# Patient Record
Sex: Female | Born: 1935 | Race: White | Hispanic: No | Marital: Married | Smoking: Former smoker
Health system: Southern US, Community
[De-identification: ages and names within clinical notes are randomized; demographics above are authoritative.]

## PROBLEM LIST (undated history)

## (undated) DIAGNOSIS — D62 Acute posthemorrhagic anemia: Secondary | ICD-10-CM

## (undated) DIAGNOSIS — D638 Anemia in other chronic diseases classified elsewhere: Secondary | ICD-10-CM

## (undated) DIAGNOSIS — E538 Deficiency of other specified B group vitamins: Secondary | ICD-10-CM

## (undated) DIAGNOSIS — C562 Malignant neoplasm of left ovary: Secondary | ICD-10-CM

## (undated) DIAGNOSIS — R4182 Altered mental status, unspecified: Secondary | ICD-10-CM

## (undated) DIAGNOSIS — C801 Malignant (primary) neoplasm, unspecified: Secondary | ICD-10-CM

## (undated) DIAGNOSIS — T451X5A Adverse effect of antineoplastic and immunosuppressive drugs, initial encounter: Secondary | ICD-10-CM

## (undated) DIAGNOSIS — G620 Drug-induced polyneuropathy: Secondary | ICD-10-CM

## (undated) HISTORY — DX: Malignant neoplasm of left ovary: C56.2

## (undated) HISTORY — PX: OTHER SURGICAL HISTORY: SHX169

## (undated) HISTORY — DX: Drug-induced polyneuropathy: G62.0

## (undated) HISTORY — DX: Deficiency of other specified B group vitamins: E53.8

## (undated) HISTORY — DX: Malignant (primary) neoplasm, unspecified: C80.1

## (undated) HISTORY — DX: Adverse effect of antineoplastic and immunosuppressive drugs, initial encounter: T45.1X5A

## (undated) HISTORY — PX: REPLACEMENT TOTAL KNEE: SUR1224

---

## 1997-02-05 ENCOUNTER — Ambulatory Visit: Admission: RE | Admit: 1997-02-05 | Discharge: 1997-02-05 | Payer: Self-pay | Admitting: Obstetrics & Gynecology

## 2005-02-28 ENCOUNTER — Other Ambulatory Visit: Admission: RE | Admit: 2005-02-28 | Discharge: 2005-02-28 | Payer: Self-pay | Admitting: Dermatology

## 2007-04-05 ENCOUNTER — Ambulatory Visit (HOSPITAL_COMMUNITY): Admission: RE | Admit: 2007-04-05 | Discharge: 2007-04-05 | Payer: Self-pay | Admitting: Ophthalmology

## 2007-04-19 ENCOUNTER — Ambulatory Visit (HOSPITAL_COMMUNITY): Admission: RE | Admit: 2007-04-19 | Discharge: 2007-04-19 | Payer: Self-pay | Admitting: Ophthalmology

## 2008-04-01 ENCOUNTER — Inpatient Hospital Stay (HOSPITAL_COMMUNITY): Admission: RE | Admit: 2008-04-01 | Discharge: 2008-04-04 | Payer: Self-pay | Admitting: Orthopedic Surgery

## 2010-04-14 LAB — BASIC METABOLIC PANEL
BUN: 9 mg/dL (ref 6–23)
CO2: 29 mEq/L (ref 19–32)
CO2: 30 mEq/L (ref 19–32)
Calcium: 8.6 mg/dL (ref 8.4–10.5)
Calcium: 8.9 mg/dL (ref 8.4–10.5)
Chloride: 97 mEq/L (ref 96–112)
Creatinine, Ser: 0.97 mg/dL (ref 0.4–1.2)
Creatinine, Ser: 1.04 mg/dL (ref 0.4–1.2)
Creatinine, Ser: 1.15 mg/dL (ref 0.4–1.2)
GFR calc Af Amer: 55 mL/min — ABNORMAL LOW (ref >60)
GFR calc Af Amer: >60 mL/min (ref >60)
GFR calc Af Amer: >60 mL/min (ref >60)
GFR calc non Af Amer: 56 mL/min — ABNORMAL LOW (ref >60)
Glucose, Bld: 123 mg/dL — ABNORMAL HIGH (ref 70–99)

## 2010-04-14 LAB — CBC
HCT: 24.7 % — ABNORMAL LOW (ref 36.0–46.0)
MCHC: 34.8 g/dL (ref 30.0–36.0)
MCHC: 35.4 g/dL (ref 30.0–36.0)
MCV: 92 fL (ref 78.0–100.0)
Platelets: 185 10*3/uL (ref 150–400)
Platelets: 198 10*3/uL (ref 150–400)
RBC: 2.62 MIL/uL — ABNORMAL LOW (ref 3.87–5.11)
RBC: 2.69 MIL/uL — ABNORMAL LOW (ref 3.87–5.11)
RBC: 2.76 MIL/uL — ABNORMAL LOW (ref 3.87–5.11)
RDW: 13.2 % (ref 11.5–15.5)
WBC: 6.8 10*3/uL (ref 4.0–10.5)

## 2010-04-14 LAB — OSMOLALITY: Osmolality: 281 mOsm/kg (ref 275–300)

## 2010-04-15 LAB — BASIC METABOLIC PANEL
BUN: 9 mg/dL (ref 6–23)
CO2: 29 mEq/L (ref 19–32)
Calcium: 8.1 mg/dL — ABNORMAL LOW (ref 8.4–10.5)
Creatinine, Ser: 0.92 mg/dL (ref 0.4–1.2)
GFR calc non Af Amer: 59 mL/min — ABNORMAL LOW (ref >60)
Glucose, Bld: 121 mg/dL — ABNORMAL HIGH (ref 70–99)
Sodium: 135 mEq/L (ref 135–145)

## 2010-04-15 LAB — TYPE AND SCREEN
ABO/RH(D): O NEG
Antibody Screen: NEGATIVE

## 2010-04-15 LAB — CBC
HCT: 38.7 % (ref 36.0–46.0)
Hemoglobin: 9.5 g/dL — ABNORMAL LOW (ref 12.0–15.0)
MCHC: 34.5 g/dL (ref 30.0–36.0)
MCV: 92.7 fL (ref 78.0–100.0)
Platelets: 187 10*3/uL (ref 150–400)
RBC: 4.18 MIL/uL (ref 3.87–5.11)
RDW: 13.4 % (ref 11.5–15.5)
WBC: 8.5 10*3/uL (ref 4.0–10.5)

## 2010-04-15 LAB — URINE CULTURE: Colony Count: 1000

## 2010-04-15 LAB — COMPREHENSIVE METABOLIC PANEL
AST: 22 U/L (ref 0–37)
Alkaline Phosphatase: 36 U/L — ABNORMAL LOW (ref 39–117)
BUN: 22 mg/dL (ref 6–23)
CO2: 27 mEq/L (ref 19–32)
Chloride: 101 mEq/L (ref 96–112)
Creatinine, Ser: 1.06 mg/dL (ref 0.4–1.2)
GFR calc Af Amer: >60 mL/min (ref >60)
GFR calc non Af Amer: 50 mL/min — ABNORMAL LOW (ref >60)
Potassium: 3.9 mEq/L (ref 3.5–5.1)
Total Bilirubin: 0.6 mg/dL (ref 0.3–1.2)

## 2010-04-15 LAB — DIFFERENTIAL
Basophils Absolute: 0 10*3/uL (ref 0.0–0.1)
Basophils Relative: 0 % (ref 0–1)
Eosinophils Relative: 2 % (ref 0–5)
Lymphocytes Relative: 26 % (ref 12–46)
Monocytes Absolute: 0.4 10*3/uL (ref 0.1–1.0)

## 2010-04-15 LAB — URINALYSIS, ROUTINE W REFLEX MICROSCOPIC
Bilirubin Urine: NEGATIVE
Glucose, UA: NEGATIVE mg/dL
Hgb urine dipstick: NEGATIVE
Ketones, ur: NEGATIVE mg/dL
Protein, ur: NEGATIVE mg/dL
pH: 5.5 (ref 5.0–8.0)

## 2010-04-15 LAB — PROTIME-INR: Prothrombin Time: 13.6 seconds (ref 11.6–15.2)

## 2010-05-18 NOTE — Discharge Summary (Signed)
NAMEMarland Kitchen  Janice Rogers, Janice Rogers NO.:  [id number]   MEDICAL RECORD NO.:  [id number]          PATIENT TYPE:  INP   LOCATION:  5017                         FACILITY:  [facility]   PHYSICIAN:  Claude Manges. Whitfield, M.D.DATE OF BIRTH:  1928/09/29   DATE OF ADMISSION:  04/01/2008  DATE OF DISCHARGE:  04/04/2008                               DISCHARGE SUMMARY   ADMISSION DIAGNOSIS:  Osteoarthritis of the left knee.   DISCHARGE DIAGNOSES:  1. Osteoarthritis of the left knee.  2. Hypothyroidism.  3. Hypertension.  4. Gastroesophageal reflux disease.   PROCEDURE:  Left total knee arthroplasty.   HISTORY:  Janice Rogers is a 77 year old female with longstanding  left knee pain.  Underwent arthroscopic debridement of the left knee  with subsequent Euflexxa injections which have failed.  Now having  severe pain with constant ache.  Radiographic osteoarthritis of the left  knee.  Indicated for left total knee arthroplasty.   HOSPITAL COURSE:  This 77 year old female was admitted March 24, 2008,  after appropriate laboratory studies were obtained as well as 1 gram of  vancomycin IV on-call to the operating room.  She was taken to the  operating room where she underwent a left total knee arthroplasty.  She  tolerated the procedure well.  She was continued on vancomycin 1 gram IV  q.12 h times one dose.  A Dilaudid reduced dose PCA pump was used for  pain management.  She was started on Lovenox 30 mg subcu q.12 h.  SCDs  for antithrombotic prophylaxis.  Consult PT, OT were made.  She was  allowed to be weightbearing as tolerated.  A Foley was placed  intraoperatively.  She did have a sore throat postoperatively and was  started on Cepacol lozenges.  She was allowed out of bed to the chair  the following day.  Her PCA was discontinued and she was hep locked.  She did develop hyponatremia and hypokalemia on April 1.  At that time  her hydrochlorothiazide was held.  Potassium chloride 30  mEq p.o. b.i.d.  was given.  She was fluid restricted to 1000 mL per 24 hours.  CPM was  increased to zero to 60 degrees for 6-8 hours per day.  Her immobilizer  was also discontinued.  The remainder of her hospital course was  uneventful.  She was discharged on April 2 to return back to the office  in 2 weeks for followup.  EKG revealed sinus bradycardia with sinus  arrhythmias, otherwise normal.   RADIOGRAPHIC STUDIES:  Chest x-ray of March 27, 2008, revealed no acute  disease.   LABORATORY STUDIES:  Admitted with a hemoglobin of 13.5, hematocrit  38.7%, white count 8500, platelets were 273,000.  Discharge hemoglobin  8.6, hematocrit 24.2%, white count 6800, platelets 185,000.  Preop coag  studies a pro-time 13.6, INR 1.0, PTT was 26.  Preop sodium 136,  potassium 3.9, chloride 101, CO2 27, glucose 96, BUN 22, creatinine  1.06, GFR was 50, bilirubin 0.6, alk phos 36, GOT 22, GPT 15, total  protein 6.8, albumin 3.8, calcium 9.9.  Discharge sodium 142, potassium  4.2, chloride 99, CO2 31, glucose  112, BUN 10, creatinine 0.97, GFR 56,  calcium 8.9.  On April 1 the sodium was 130, potassium 3.0 and repeated  8 hours later revealed a sodium 137, potassium 3.2.  Urinalysis was  benign for voided urine.  Urine culture showed 1000 colonies of  insignificant growth.   DISCHARGE INSTRUCTIONS:  She may be returned to a regular diet.  She is  to increase her activity slowly.  Use her walker three-point gait  weightbearing as tolerated.  She may shower on Sunday, April 06, 2008.  She can allow the water to run over the incision but do not scrub over  the incision.  Let the incision air dry.  May use a Betadine swab over  the staples and then cover with sterile 4x4 dressings.  This is to be  done once daily.  CPM zero to 60 degrees for 6-8 hours per day  increasing by 5-10 degrees per day.   DISCHARGE MEDICATIONS:  1. Medications to include Premarin 0.625 daily.  2. Levothyroxine 100 mcg  daily.  3. Atenolol 50 mg daily.  4. TriCor 145 mg daily.  5. Colace 100 mg p.o. b.i.d. as a stool softener.  6. Prilosec 20 mg daily.  7. Triamterene/HCTZ 37.5/25 one p.o. q.h.s.  8. Benadryl 25 mg q.h.s. for sleep.  9. Senokot-S p.r.n. constipation.  10.Tylenol 325 mg 1 to 2 tablets q.4 h p.r.n. not to exceed Tylenol      total consumption of 4000 mg per 24 hours.  11.Percocet 5/325 at 1 to 2 tabs every 4 as needed for pain.  12.Robaxin 500 mg p.o. t.i.d. p.r.n. spasms.  13.Lovenox 30 mg subcu b.i.d. (q.12 hours for a total of 2 weeks).   DISCHARGE CONDITION:  She was discharged in improved condition.      Oris Drone Petrarca, P.A.-C.      Claude Manges. Cleophas Dunker, M.D.  Electronically Signed    BDP/MEDQ  D:  04/04/2008  T:  04/04/2008  Job:  [id number]

## 2010-05-18 NOTE — Op Note (Signed)
NAMEMarland Kitchen  BROOK, GERACI NO.:  [id number]   MEDICAL RECORD NO.:  [id number]          PATIENT TYPE:  INP   LOCATION:  5017                         FACILITY:  [facility]   PHYSICIAN:  Rodney A. Mortenson, M.D.DATE OF BIRTH:  12-Jan-1928   DATE OF PROCEDURE:  04/01/2008  DATE OF DISCHARGE:                               OPERATIVE REPORT   PREOPERATIVE DIAGNOSIS:  End-stage osteoarthritis, left knee.   POSTOPERATIVE DIAGNOSIS:  End-stage osteoarthritis, left knee.   OPERATION:  Total knee replacement on left using computer navigation  using DePuy a standard size, +12.5-mm poly insert with a size 3 MBT  keeled tibial tray, metal-back patella, standard plus, and a standard  plus cemented primary femoral component.   SURGEON:  Lenard Galloway. Chaney Malling, MD   ASSISTANT:  Dr. Su Hilt.   ANESTHESIA:  General.   PROCEDURE:  The patient placed on the operating table in supine position  with pneumatic tourniquet about the left upper thigh.  The entire left  lower extremity was prepped with DuraPrep and draped down in the usual  manner.  The leg was wrapped out with an Esmarch, tourniquet was  elevated.  Incision made from the tibial tubercle to above the patella  about 4 inches.  Skin edges were retracted.  A long medial parapatellar  incision was made.  Self-retaining retractor was put in place.  A  synovectomy was then done.  Knee was flexed 90 degrees.  Both medial and  lateral meniscus were excised and ACL was released in the femoral notch  area.  Once accomplished to my satisfaction, excellent access to the  joint was achieved.  Two Schanz pins were placed in the distal femur in  the standard manner and two Schanz pins were placed in the proximal  tibia in the standard manner.  Femoral and tibial arrays were then  applied and registration was started.  First, the femoral head center  was registered following all the computer promptings and tibial  mechanical axis, then we defined  the proximal tibial plateau and defined  the femoral condyles and epicondyles, all following computer promptings.  When this was done, tibial planning was done.  Tibial resection settings  were then registered, and the tibial resection guide was placed over the  anterior aspect of the tibia.  Following computer promptings, this was  placed in the ideal position and locked in position with locking pins  and proximal tibia was amputated a large single piece.  This was down  very successfully.  A confirmation guide was used and the first cut was  ideal.  Femoral implant planning was then done following computer  promptings and then each step was followed.  First, the anterior and  posterior femoral resection guide was put in place, locked in position  following computer promptings, and the anterior and posterior cuts were  made.  Confirmation guide was used to help make the cuts in an ideal  position.  Once this was accomplished, femoral pin and guide was placed  over the distal end of the femur.  Following the resection suggestions,  it was locked in position, and distal end of the  femur was amputated.  The confirmation guide saw was used to help clean out these cuts.  Next,  the final femoral cutting jig was placed over the distal end of femur  and locked in position.  Chamfer cuts were made and large drill holes  were made.  At this point, the tibia was subluxed anteriorly using a  McHale and this exposed a proximal tibial cut surface.  The size 3 keel  fit very nice and this was locked in position.  The collar was put in  position on the cutting block and drill holes made in the proximal  tibia.  Then, the wing broaches passed down the proximal end of tibia  and locked in position.  A 12.5-mm poly seem to be the appropriate poly,  it was put in position, and the trial femoral components were placed  over the distal end of femur and reduced.  The knee was then put through  full range of  motion.  There was excellent stability, good full range of  motion, and AP drawer was minimal.  No instability to varus and valgus  stress.  There was no spinning of the poly, excellent tracking of the  poly.  Attention was then turned to the posterior aspect of the patella.  The patella cutting guide was put in position.  Posterior aspect of the  patella amputated and then 3 drill holes were placed on the back of the  patella using a drill hole guide.  Next, the patella trial was put in  position.  The knee was then articulated and a lateral release was not  felt be necessary as the patella was tracking very nicely in the midline  with no tilt.  All the trial components were then removed.  Pulsating  lavage was used.  All bony debris was removed.  Glue was mixed and each  of the components were sequentially inserted starting with the tibia and  then the distal femur, the poly trial, and the patella.  The glue was  allowed to cure.  Once this had cured, an osteotome was used to remove  any excess glue that was seen, and the knee was irrigated again with  pulsating lavage.  All debris was removed.  The final trial poly was  removed.  Tourniquet was dropped and bleeders were coagulated.  The  wound was fairly dry and no arterial pumpers were seen.  FloSeal was  placed in the wound.  Once this was accomplished, the final tibial poly  was then inserted in place and the knee articulated once again.  Again,  there was excellent motion and excellent stability.  The long medial  parapatellar incision was then closed with interrupted Ethibond sutures.  Good stability and good range of motion.  Subcutaneous tissue was closed  with Vicryl and skin was closed with stainless steel staples.  A sterile  dressing was applied, and the patient returned to recovery room in  excellent condition.  Technically, this went extremely well.  I was very  pleased with final surgical outcome, range of motion, and  stability.      Rodney A. Chaney Malling, M.D.  Electronically Signed     RAM/MEDQ  D:  04/01/2008  T:  04/02/2008  Job:  [id number]

## 2010-09-27 LAB — BASIC METABOLIC PANEL
Calcium: 9.3
GFR calc non Af Amer: 57 — ABNORMAL LOW
Glucose, Bld: 109 — ABNORMAL HIGH
Potassium: 4.3
Sodium: 138

## 2010-09-27 LAB — HEMOGLOBIN AND HEMATOCRIT, BLOOD: Hemoglobin: 13.5

## 2010-11-22 DIAGNOSIS — R0602 Shortness of breath: Secondary | ICD-10-CM

## 2011-01-12 DIAGNOSIS — Z452 Encounter for adjustment and management of vascular access device: Secondary | ICD-10-CM

## 2011-01-12 DIAGNOSIS — C569 Malignant neoplasm of unspecified ovary: Secondary | ICD-10-CM

## 2011-05-31 ENCOUNTER — Encounter: Payer: Medicare Other | Admitting: Hematology and Oncology

## 2011-05-31 DIAGNOSIS — Z5111 Encounter for antineoplastic chemotherapy: Secondary | ICD-10-CM

## 2011-05-31 DIAGNOSIS — C569 Malignant neoplasm of unspecified ovary: Secondary | ICD-10-CM

## 2011-06-08 ENCOUNTER — Encounter (INDEPENDENT_AMBULATORY_CARE_PROVIDER_SITE_OTHER): Payer: Medicare Other | Admitting: Internal Medicine

## 2011-06-08 DIAGNOSIS — D649 Anemia, unspecified: Secondary | ICD-10-CM

## 2011-06-08 DIAGNOSIS — C569 Malignant neoplasm of unspecified ovary: Secondary | ICD-10-CM

## 2011-06-08 DIAGNOSIS — R5383 Other fatigue: Secondary | ICD-10-CM

## 2011-06-08 DIAGNOSIS — R197 Diarrhea, unspecified: Secondary | ICD-10-CM

## 2011-06-08 DIAGNOSIS — R5381 Other malaise: Secondary | ICD-10-CM

## 2011-07-06 ENCOUNTER — Encounter (INDEPENDENT_AMBULATORY_CARE_PROVIDER_SITE_OTHER): Payer: Medicare Other | Admitting: Internal Medicine

## 2011-07-06 DIAGNOSIS — D649 Anemia, unspecified: Secondary | ICD-10-CM

## 2011-07-06 DIAGNOSIS — C569 Malignant neoplasm of unspecified ovary: Secondary | ICD-10-CM

## 2011-08-01 ENCOUNTER — Encounter: Payer: Medicare Other | Admitting: Internal Medicine

## 2011-08-01 DIAGNOSIS — C569 Malignant neoplasm of unspecified ovary: Secondary | ICD-10-CM

## 2011-08-02 ENCOUNTER — Encounter: Payer: Medicare Other | Admitting: Internal Medicine

## 2011-09-12 DIAGNOSIS — Z452 Encounter for adjustment and management of vascular access device: Secondary | ICD-10-CM

## 2011-09-12 DIAGNOSIS — C569 Malignant neoplasm of unspecified ovary: Secondary | ICD-10-CM

## 2011-10-17 DIAGNOSIS — C569 Malignant neoplasm of unspecified ovary: Secondary | ICD-10-CM

## 2011-10-17 DIAGNOSIS — Z452 Encounter for adjustment and management of vascular access device: Secondary | ICD-10-CM

## 2011-11-22 DIAGNOSIS — Z452 Encounter for adjustment and management of vascular access device: Secondary | ICD-10-CM

## 2011-11-22 DIAGNOSIS — C569 Malignant neoplasm of unspecified ovary: Secondary | ICD-10-CM

## 2012-01-27 ENCOUNTER — Encounter (INDEPENDENT_AMBULATORY_CARE_PROVIDER_SITE_OTHER): Payer: Self-pay

## 2012-01-27 DIAGNOSIS — E039 Hypothyroidism, unspecified: Secondary | ICD-10-CM

## 2012-01-27 DIAGNOSIS — I10 Essential (primary) hypertension: Secondary | ICD-10-CM

## 2012-01-27 DIAGNOSIS — C569 Malignant neoplasm of unspecified ovary: Secondary | ICD-10-CM

## 2012-02-21 DIAGNOSIS — C569 Malignant neoplasm of unspecified ovary: Secondary | ICD-10-CM

## 2012-04-03 DIAGNOSIS — C569 Malignant neoplasm of unspecified ovary: Secondary | ICD-10-CM

## 2012-04-03 DIAGNOSIS — Z452 Encounter for adjustment and management of vascular access device: Secondary | ICD-10-CM

## 2012-05-17 DIAGNOSIS — Z452 Encounter for adjustment and management of vascular access device: Secondary | ICD-10-CM

## 2012-05-17 DIAGNOSIS — C569 Malignant neoplasm of unspecified ovary: Secondary | ICD-10-CM

## 2012-06-28 DIAGNOSIS — C569 Malignant neoplasm of unspecified ovary: Secondary | ICD-10-CM

## 2012-06-28 DIAGNOSIS — Z452 Encounter for adjustment and management of vascular access device: Secondary | ICD-10-CM

## 2012-08-14 DIAGNOSIS — Z452 Encounter for adjustment and management of vascular access device: Secondary | ICD-10-CM

## 2012-08-14 DIAGNOSIS — C569 Malignant neoplasm of unspecified ovary: Secondary | ICD-10-CM

## 2012-09-25 DIAGNOSIS — C569 Malignant neoplasm of unspecified ovary: Secondary | ICD-10-CM

## 2012-09-25 DIAGNOSIS — Z452 Encounter for adjustment and management of vascular access device: Secondary | ICD-10-CM

## 2012-11-06 DIAGNOSIS — C569 Malignant neoplasm of unspecified ovary: Secondary | ICD-10-CM

## 2012-11-06 DIAGNOSIS — Z452 Encounter for adjustment and management of vascular access device: Secondary | ICD-10-CM

## 2015-08-05 ENCOUNTER — Encounter (HOSPITAL_BASED_OUTPATIENT_CLINIC_OR_DEPARTMENT_OTHER): Payer: Medicare Other

## 2015-08-05 ENCOUNTER — Encounter (HOSPITAL_COMMUNITY): Payer: Self-pay | Admitting: Oncology

## 2015-08-05 ENCOUNTER — Ambulatory Visit (HOSPITAL_COMMUNITY): Payer: Self-pay | Admitting: Hematology & Oncology

## 2015-08-05 ENCOUNTER — Encounter (HOSPITAL_COMMUNITY): Payer: Medicare Other | Attending: Oncology | Admitting: Oncology

## 2015-08-05 VITALS — BP 179/83 | HR 60 | Temp 97.9°F | Resp 16 | Ht 63.0 in | Wt 157.2 lb

## 2015-08-05 DIAGNOSIS — E538 Deficiency of other specified B group vitamins: Secondary | ICD-10-CM | POA: Diagnosis not present

## 2015-08-05 DIAGNOSIS — C562 Malignant neoplasm of left ovary: Secondary | ICD-10-CM | POA: Diagnosis not present

## 2015-08-05 DIAGNOSIS — Z95828 Presence of other vascular implants and grafts: Secondary | ICD-10-CM

## 2015-08-05 HISTORY — DX: Malignant neoplasm of left ovary: C56.2

## 2015-08-05 HISTORY — DX: Deficiency of other specified B group vitamins: E53.8

## 2015-08-05 MED ORDER — HEPARIN SOD (PORK) LOCK FLUSH 100 UNIT/ML IV SOLN
INTRAVENOUS | Status: AC
  Filled 2015-08-05: qty 5

## 2015-08-05 MED ORDER — HEPARIN SOD (PORK) LOCK FLUSH 100 UNIT/ML IV SOLN
500.0000 [IU] | Freq: Once | INTRAVENOUS | Status: AC
  Administered 2015-08-05: 500 [IU] via INTRAVENOUS

## 2015-08-05 MED ORDER — SODIUM CHLORIDE 0.9% FLUSH
10.0000 mL | Freq: Once | INTRAVENOUS | Status: AC
  Administered 2015-08-05: 10 mL via INTRAVENOUS

## 2015-08-05 MED ORDER — CYANOCOBALAMIN 1000 MCG/ML IJ SOLN: 1000.0000 ug | INTRAMUSCULAR | 11 refills | Status: DC

## 2015-08-05 NOTE — Patient Instructions (Signed)
[place] at [facility] Discharge Instructions  RECOMMENDATIONS MADE BY THE CONSULTANT AND ANY TEST RESULTS WILL BE SENT TO YOUR REFERRING PHYSICIAN.  Port flush today as ordered.  Thank you for choosing [place] at [facility] to provide your oncology and hematology care.  To afford each patient quality time with our provider, please arrive at least 15 minutes before your scheduled appointment time.   Beginning January 23rd 2017 lab work for the [facility] will be done in the  Main lab at [facility] on 1st floor. If you have a lab appointment with the [facility] please come in thru the  Main Entrance and check in at the main information desk  You need to re-schedule your appointment should you arrive 10 or more minutes late.  We strive to give you quality time with our providers, and arriving late affects you and other patients whose appointments are after yours.  Also, if you no show three or more times for appointments you may be dismissed from the clinic at the providers discretion.     Again, thank you for choosing [facility].  Our hope is that these requests will decrease the amount of time that you wait before being seen by our physicians.       _____________________________________________________________  Should you have questions after your visit to [facility], please contact our office at [phone] between the hours of 8:30 a.m. and 4:30 p.m.  Voicemails left after 4:30 p.m. will not be returned until the following business day.  For prescription refill requests, have your pharmacy contact our office.         Resources For Cancer Patients and their Caregivers ? American Cancer Society: Can assist with transportation, wigs, general needs, runs Look Good Feel Better.        210-710-1053 ? Cancer Care: Provides financial assistance, online support groups, medication/co-pay assistance.   1-800-813-HOPE 670 682 0343) ? [facility] Assists [place] cancer patients and their families through emotional , educational and financial support.  614-041-7089 ? [place] DSS Where to apply for food stamps, Medicaid and utility assistance. 416 692 4072 ? [facility]: Transportation to medical appointments. 865-192-6480 ? Social Security Administration: May apply for disability if have a Stage IV cancer. 919-743-8364 813-232-8028 ? [employer], Disability and Transit Services: Assists with nutrition, care and transit needs. [facility] Support Programs: @10RELATIVEDAYS @ > Cancer Support Group  2nd Tuesday of the month 1pm-2pm, Journey Room  > Creative Journey  3rd Tuesday of the month 1130am-1pm, Journey Room  > Look Good Feel Better  1st Wednesday of the month 10am-12 noon, Journey Room (Call [facility] to register [phone])

## 2015-08-05 NOTE — Assessment & Plan Note (Signed)
B12 deficiency, B12 1000 mcg injections are being administered locally by her pharmacy.  New Rx for B12 1000 mcg injection x 11 refills escribed to her pharmacy.

## 2015-08-05 NOTE — Assessment & Plan Note (Addendum)
Clinical stage IIIC LEFT Ovarian cancer treated surgically on 08/22/2008 followed by 6 cycles of Carboplatin/Paclitaxel?Avastin in accordance with GOG 252- regimen II) finishing in March 2011 followed by Avastin maintence untile recurrent disease was documented on 08/30/2010 at which time she was treated with Carboplatin/Gemcitabine finishing in July 2013 with transition to Tamoxifen 20 mg daily (07/28/2011- 07/09/2013), then Doxil x 6 cycles (07/10/2013- 12/03/2013).  Currently on Letrozole daily since ~ Jan 2017.  Oncology history is developed.    Port flush is due today.  This will be completed.  No role for labs today.    Labs in 6 weeks with her next port flush: CBC diff, CMET, CA 125.  She is to continue follow-up with Dr. Rhodia Albright at [facility] as scheduled on 09/10/2015.  Return in 6 weeks for port-flush and labs.  Return in 3 months for follow-up.  We will be on the look out for Dr. Asa Saunas upcoming note.

## 2015-08-05 NOTE — Progress Notes (Signed)
[facility] Hematology/Oncology Consultation   Name: Janice Rogers      MRN: [id number]   Date: 09/06/2015 Time:11:13 PM   REFERRING PHYSICIAN:  Everardo All, MD (Medical Oncologist at [facility])  [place]:  Transfer of medical oncology care   DIAGNOSIS:  Clinically Stage IIIC Ovarian Cancer, Left    Ovarian cancer on left [facility])   08/22/2008 Procedure    Exploratory laparotomy, BSO, omentectomy, and bilateral pelvic and para-aortic lymph node dissection (optimally debulked)      10/22/2008 - 03/05/2009 Chemotherapy    GOG 252 Regimen II: Taxol 80 mg/m2 IV days 1, 8, and 15, IP Carboplatin AUC 6 day 1, and Avastin 15 mg/kg day 1 every 21 days x 6 cycles      03/13/2009 - 02/03/2010 Chemotherapy    GOG Regimen II: Maintenance Avastin 15 mg/kg x 16 additional cycles      08/26/2010 Progression    Recurrent disease documented      08/30/2010 - 06/01/2011 Chemotherapy    Gemcitabine 800 mg/m, carboplatin AUC 2 days 1, 8, 15 every 28 days.      07/28/2011 - 07/09/2013 Anti-estrogen oral therapy    Tamoxifen 20 mg daily      07/09/2013 Progression    Progression of disease noted on CT scan      07/10/2013 - 12/03/2013 Chemotherapy    Doxil 50 mg/m every 4 weeks 6 cycles      12/18/2014 Imaging    CT CAP- overall unchanged peritoneal metastatic disease. Multiple other findings are also unchanged from prior CTs. Stable disease.      01/12/2015 -  Anti-estrogen oral therapy    Femara 2.5 mg (Approximate start date)        HISTORY OF PRESENT ILLNESS:   Janice Rogers is a 82 y.o. female with a medical history significant for B12 deficiency, recurrent falls who is referred to the [facility] for transfer of her medical oncology care for clinical stage IIIC ovarian cancer, left. I personally reviewed and went over laboratory results with the patient.  The results are noted within this dictation.  I personally reviewed and went over  radiographic studies with the patient.  The results are noted within this dictation.    Chart is reviewed.  In summary, patient was diagnosed with stage IIIc left ovarian cancer that has been managed primarily by Dr.Lentz at [facility].  She is undergone optimal debulking surgery in 2010 followed by systemic chemotherapy following the GOG 252 regimen II.  Unfortunately, in 2012 she was noted to have a recurrence of disease. She was therefore treated with gemcitabine and carboplatin and transitioned to tamoxifen maintenance therapy. In 2015, she had progression of disease noted on CT scans. She was treated in July 2015 with Doxil every 4 weeks 6 cycles finishing on 12/03/2013. Since that time, she has been on Femara maintenance therapy with CT imaging demonstrating stable disease.  More recently, she's been having difficulties with unexplained falls. She has been worked up by neurology without any clear explanation. She was noted to have a low B12 level and therefore this been replaced. Unfortunately, this has not corrected her symptoms. Appetite is fine. Denies abdominal pain. Takes her femara daily.    Review of Systems  Constitutional: Negative.   HENT: Negative.   Eyes: Negative.   Respiratory: Negative.   Cardiovascular: Negative.   Gastrointestinal: Negative.   Genitourinary: Negative.   Musculoskeletal: Positive for falls.  Skin: Negative.   Neurological: Negative.   Endo/Heme/Allergies: Negative.   Psychiatric/Behavioral: Negative.   14 point review of systems was performed and is negative except as detailed under history of present illness and above   PAST MEDICAL HISTORY:   Past Medical History:  Diagnosis Date  . B12 deficiency 08/05/2015  . Cancer ([place])   . Ovarian cancer on left ([place]) 08/05/2015    ALLERGIES: Allergies  Allergen Reactions  . Doxycycline Rash and Hives  . Gemcitabine Shortness Of Breath  . Penicillins Rash and Hives      MEDICATIONS: I have reviewed the  patient's current medications.     PAST SURGICAL HISTORY Past Surgical History:  Procedure Laterality Date  . catarct surgery    . REPLACEMENT TOTAL KNEE    BSO, omentectomy, bilateral pelvic and para aortic LN dissection Breast biopsy X 2  FAMILY HISTORY: History reviewed. No pertinent family history. Cancer Mother  . Hearing loss Mother  . Vision loss Mother  . Arthritis Father  . Cancer Father  . Diabetes Sister  . Early death Brother    SOCIAL HISTORY:  reports that she has quit smoking. She has never used smokeless tobacco. She reports that she does not drink alcohol or use drugs.  Social History   Social History  . Marital status: Married    Spouse name: N/A  . Number of children: N/A  . Years of education: N/A   Social History Main Topics  . Smoking status: Former Research scientist (life sciences)  . Smokeless tobacco: Never Used  . Alcohol use No  . Drug use: No  . Sexual activity: Not Asked   Other Topics Concern  . None   Social History Narrative  . None  Smoking status: Former Smoker  Packs/day: 0.50  Years: 15.00  . Smokeless tobacco: Never Used  . Alcohol use Yes  Comment: very little  One daughter. No regular exercise. No pets.    PERFORMANCE STATUS: The patient's performance status is 1 - Symptomatic but completely ambulatory  PHYSICAL EXAM: Most Recent Vital Signs: Blood pressure (!) 179/83, pulse 60, temperature 97.9 F (36.6 C), resp. rate 16, height 5\' 3"  (1.6 m), weight 157 lb 3.2 oz (71.3 kg), SpO2 98 %. General appearance: alert, cooperative, appears stated age, no distress and accompanied by husband Head: Normocephalic, without obvious abnormality, atraumatic Eyes: negative findings: lids and lashes normal, conjunctivae and sclerae normal and corneas clear Throat: normal findings: oropharynx pink & moist without lesions or evidence of thrush Neck: no adenopathy and supple, symmetrical, trachea midline Lungs: clear to auscultation bilaterally Heart:  regular rate and rhythm, S1, S2 normal, no murmur, click, rub or gallop Abdomen: soft, non-tender; bowel sounds normal; no masses,  no organomegaly Extremities: extremities normal, atraumatic, no cyanosis or edema Skin: Skin color, texture, turgor normal. No rashes or lesions Lymph nodes: Cervical, supraclavicular, and axillary nodes normal. Neurologic: Grossly normal  LABORATORY DATA:        RADIOGRAPHY:    PATHOLOGY:  N/A   ASSESSMENT/PLAN:   Ovarian cancer on left [facility]) Indwelling Port a cath  Clinical stage IIIC LEFT Ovarian cancer treated surgically on 08/22/2008 followed by 6 cycles of Carboplatin/Paclitaxel?Avastin in accordance with GOG 252- regimen II) finishing in March 2011 followed by Avastin maintence untile recurrent disease was documented on 08/30/2010 at which time she was treated with Carboplatin/Gemcitabine finishing in July 2013 with transition to Tamoxifen 20 mg daily (07/28/2011- 07/09/2013), then Doxil x 6 cycles (07/10/2013- 12/03/2013).  Currently on Letrozole daily since ~  Jan 2017.  Oncology history is developed.    Port flush is due today.  This will be completed.  No role for labs today.    Labs in 6 weeks with her next port flush: CBC diff, CMET, CA 125.  She is to continue follow-up with Dr. Rhodia Albright at [facility] as scheduled on 09/10/2015.  Return in 6 weeks for port-flush and labs.  Return in 3 months for follow-up.  We will be on the look out for Dr. Asa Saunas upcoming note.  B12 deficiency Falls B12 deficiency, B12 1000 mcg injections are being administered locally by her pharmacy.  New Rx for B12 1000 mcg injection x 11 refills escribed to her pharmacy.   ORDERS PLACED FOR THIS ENCOUNTER: Orders Placed This Encounter  Procedures  . CBC with Differential  . Comprehensive metabolic panel  . CA 125  . Vitamin B12  . CBC with Differential  . Comprehensive metabolic panel  . CA 125    All questions were answered. The patient knows to call the clinic  with any problems, questions or concerns. We can certainly see the patient much sooner if necessary.  This note is electronically signed [id number] Cyril Mourning, MD  09/06/2015 11:13 PM

## 2015-08-05 NOTE — Progress Notes (Signed)
Janice Rogers presented for Portacath access and flush. Proper placement of portacath confirmed by CXR. Portacath located right chest wall accessed with  H 20 needle. Good blood return present. Portacath flushed with 69ml NS and 500U/92ml Heparin and needle removed intact. Procedure without incident. Patient tolerated procedure well.

## 2015-08-05 NOTE — Patient Instructions (Signed)
Janice Rogers at [facility] Discharge Instructions  RECOMMENDATIONS MADE BY THE CONSULTANT AND ANY TEST RESULTS WILL BE SENT TO YOUR REFERRING PHYSICIAN.  Port flush today and every 6 weeks.  Return in 3 months for follow up with Dr. Whitney Muse.   Thank you for choosing [place] at [facility] to provide your oncology and hematology care.  To afford each patient quality time with our provider, please arrive at least 15 minutes before your scheduled appointment time.   Beginning January 23rd 2017 lab work for the [facility] will be done in the  Main lab at [facility] on 1st floor. If you have a lab appointment with the [place] please come in thru the  Main Entrance and check in at the main information desk  You need to re-schedule your appointment should you arrive 10 or more minutes late.  We strive to give you quality time with our providers, and arriving late affects you and other patients whose appointments are after yours.  Also, if you no show three or more times for appointments you may be dismissed from the clinic at the providers discretion.     Again, thank you for choosing [facility].  Our hope is that these requests will decrease the amount of time that you wait before being seen by our physicians.       _____________________________________________________________  Should you have questions after your visit to [facility], please contact our office at [phone] between the hours of 8:30 a.m. and 4:30 p.m.  Voicemails left after 4:30 p.m. will not be returned until the following business day.  For prescription refill requests, have your pharmacy contact our office.         Resources For Cancer Patients and their Caregivers ? American Cancer Society: Can assist with transportation, wigs, general needs, runs Look Good Feel Better.        9075027160 ? Cancer Care: Provides financial  assistance, online support groups, medication/co-pay assistance.  1-800-813-HOPE 810-517-4643) ? [place] Assists [place] cancer patients and their families through emotional , educational and financial support.  7630054249 ? [place] DSS Where to apply for food stamps, Medicaid and utility assistance. 9104321755 ? RCATS: Transportation to medical appointments. 208 347 7547 ? Social Security Administration: May apply for disability if have a Stage IV cancer. 302-311-9562 867-314-8757 ? [employer], Disability and Transit Services: Assists with nutrition, care and transit needs. [place] Support Programs: @10RELATIVEDAYS @ > Cancer Support Group  2nd Tuesday of the month 1pm-2pm, Journey Room  > Creative Journey  3rd Tuesday of the month 1130am-1pm, Journey Room  > Look Good Feel Better  1st Wednesday of the month 10am-12 noon, Journey Room (Call [place] to register [phone])

## 2015-09-06 ENCOUNTER — Encounter (HOSPITAL_COMMUNITY): Payer: Self-pay | Admitting: Oncology

## 2015-09-16 ENCOUNTER — Encounter (HOSPITAL_COMMUNITY): Payer: Medicare Other | Attending: Oncology

## 2015-09-16 ENCOUNTER — Encounter (HOSPITAL_COMMUNITY): Payer: Self-pay

## 2015-09-16 VITALS — BP 134/88 | HR 87 | Temp 98.5°F | Resp 16

## 2015-09-16 DIAGNOSIS — E538 Deficiency of other specified B group vitamins: Secondary | ICD-10-CM | POA: Insufficient documentation

## 2015-09-16 DIAGNOSIS — Z95828 Presence of other vascular implants and grafts: Secondary | ICD-10-CM

## 2015-09-16 DIAGNOSIS — Z452 Encounter for adjustment and management of vascular access device: Secondary | ICD-10-CM

## 2015-09-16 DIAGNOSIS — C562 Malignant neoplasm of left ovary: Secondary | ICD-10-CM

## 2015-09-16 MED ORDER — SODIUM CHLORIDE 0.9% FLUSH
10.0000 mL | INTRAVENOUS | Status: DC | PRN
Start: 2015-09-16 — End: 2015-09-16
  Administered 2015-09-16: 10 mL via INTRAVENOUS
  Filled 2015-09-16: qty 10

## 2015-09-16 MED ORDER — HEPARIN SOD (PORK) LOCK FLUSH 100 UNIT/ML IV SOLN
INTRAVENOUS | Status: AC
  Filled 2015-09-16: qty 5

## 2015-09-16 MED ORDER — HEPARIN SOD (PORK) LOCK FLUSH 100 UNIT/ML IV SOLN
500.0000 [IU] | Freq: Once | INTRAVENOUS | Status: AC
  Administered 2015-09-16: 500 [IU] via INTRAVENOUS

## 2015-09-16 NOTE — Patient Instructions (Signed)
[place] at [facility] Discharge Instructions  RECOMMENDATIONS MADE BY THE CONSULTANT AND ANY TEST RESULTS WILL BE SENT TO YOUR REFERRING PHYSICIAN.  You had your prot flushed today. Return as scheduled,   Thank you for choosing [place] at [facility] to provide your oncology and hematology care.  To afford each patient quality time with our provider, please arrive at least 15 minutes before your scheduled appointment time.   Beginning January 23rd 2017 lab work for the [facility] will be done in the  Main lab at [facility] on 1st floor. If you have a lab appointment with the [place] please come in thru the  Main Entrance and check in at the main information desk  You need to re-schedule your appointment should you arrive 10 or more minutes late.  We strive to give you quality time with our providers, and arriving late affects you and other patients whose appointments are after yours.  Also, if you no show three or more times for appointments you may be dismissed from the clinic at the providers discretion.     Again, thank you for choosing [facility].  Our hope is that these requests will decrease the amount of time that you wait before being seen by our physicians.       _____________________________________________________________  Should you have questions after your visit to [facility], please contact our office at [phone] between the hours of 8:30 a.m. and 4:30 p.m.  Voicemails left after 4:30 p.m. will not be returned until the following business day.  For prescription refill requests, have your pharmacy contact our office.         Resources For Cancer Patients and their Caregivers ? American Cancer Society: Can assist with transportation, wigs, general needs, runs Look Good Feel Better.        440-515-6495 ? Cancer Care: Provides financial assistance, online support groups,  medication/co-pay assistance.  1-800-813-HOPE 662-289-3486) ? [place] Assists [place] cancer patients and their families through emotional , educational and financial support.  269-705-2004 ? [place] DSS Where to apply for food stamps, Medicaid and utility assistance. (905)030-2377 ? RCATS: Transportation to medical appointments. (469)575-2282 ? Social Security Administration: May apply for disability if have a Stage IV cancer. [phone] 794-4079 [phone]) 401-1468 ? [employer], Disability and Transit Services: Assists with nutrition, care and transit needs. [place] Support Programs: @10RELATIVEDAYS @ > Cancer Support Group  2nd Tuesday of the month 1pm-2pm, Journey Room  > Creative Journey  3rd Tuesday of the month 1130am-1pm, Journey Room  > Look Good Feel Better  1st Wednesday of the month 10am-12 noon, Journey Room (Call [place] to register [phone])

## 2015-09-16 NOTE — Progress Notes (Signed)
  Janice Rogers presented for Portacath access and flush. Portacath located right chest wall accessed with  H 20 needle. Good blood return present. Portacath flushed with 70ml NS and 500U/54ml Heparin and needle removed intact. Procedure without incident. Patient tolerated procedure well. Pt stable and discharged home ambulatory.

## 2015-10-28 ENCOUNTER — Encounter (HOSPITAL_BASED_OUTPATIENT_CLINIC_OR_DEPARTMENT_OTHER): Payer: Medicare Other

## 2015-10-28 ENCOUNTER — Encounter (HOSPITAL_COMMUNITY): Payer: Self-pay | Admitting: Hematology & Oncology

## 2015-10-28 ENCOUNTER — Encounter (HOSPITAL_COMMUNITY): Payer: Medicare Other | Attending: Oncology | Admitting: Hematology & Oncology

## 2015-10-28 VITALS — BP 158/71 | HR 72 | Temp 97.7°F | Resp 16 | Wt 152.6 lb

## 2015-10-28 DIAGNOSIS — R296 Repeated falls: Secondary | ICD-10-CM

## 2015-10-28 DIAGNOSIS — Z95828 Presence of other vascular implants and grafts: Secondary | ICD-10-CM

## 2015-10-28 DIAGNOSIS — E538 Deficiency of other specified B group vitamins: Secondary | ICD-10-CM | POA: Diagnosis not present

## 2015-10-28 DIAGNOSIS — C562 Malignant neoplasm of left ovary: Secondary | ICD-10-CM | POA: Diagnosis present

## 2015-10-28 DIAGNOSIS — Z79811 Long term (current) use of aromatase inhibitors: Secondary | ICD-10-CM

## 2015-10-28 DIAGNOSIS — G620 Drug-induced polyneuropathy: Secondary | ICD-10-CM

## 2015-10-28 DIAGNOSIS — T451X5A Adverse effect of antineoplastic and immunosuppressive drugs, initial encounter: Secondary | ICD-10-CM

## 2015-10-28 LAB — CBC WITH DIFFERENTIAL/PLATELET
Basophils Absolute: 0 10*3/uL (ref 0.0–0.1)
Basophils Relative: 0 %
EOS ABS: 0.2 10*3/uL (ref 0.0–0.7)
Eosinophils Relative: 2 %
HCT: 36.4 % (ref 36.0–46.0)
HEMOGLOBIN: 12 g/dL (ref 12.0–15.0)
LYMPHS ABS: 1.4 10*3/uL (ref 0.7–4.0)
LYMPHS PCT: 20 %
MCH: 28.8 pg (ref 26.0–34.0)
MCHC: 33 g/dL (ref 30.0–36.0)
MCV: 87.5 fL (ref 78.0–100.0)
Monocytes Absolute: 0.4 10*3/uL (ref 0.1–1.0)
Monocytes Relative: 6 %
NEUTROS PCT: 72 %
Neutro Abs: 5.1 10*3/uL (ref 1.7–7.7)
Platelets: 154 10*3/uL (ref 150–400)
RBC: 4.16 MIL/uL (ref 3.87–5.11)
RDW: 14 % (ref 11.5–15.5)
WBC: 7.2 10*3/uL (ref 4.0–10.5)

## 2015-10-28 LAB — COMPREHENSIVE METABOLIC PANEL
ALK PHOS: 48 U/L (ref 38–126)
ALT: 9 U/L — AB (ref 14–54)
AST: 14 U/L — AB (ref 15–41)
Albumin: 3.4 g/dL — ABNORMAL LOW (ref 3.5–5.0)
Anion gap: 6 (ref 5–15)
BUN: 16 mg/dL (ref 6–20)
CALCIUM: 7.7 mg/dL — AB (ref 8.9–10.3)
CO2: 22 mmol/L (ref 22–32)
CREATININE: 0.83 mg/dL (ref 0.44–1.00)
Chloride: 111 mmol/L (ref 101–111)
GFR calc non Af Amer: >60 mL/min (ref >60)
GLUCOSE: 87 mg/dL (ref 65–99)
Potassium: 2.8 mmol/L — ABNORMAL LOW (ref 3.5–5.1)
SODIUM: 139 mmol/L (ref 135–145)
Total Bilirubin: 0.8 mg/dL (ref 0.3–1.2)
Total Protein: 6.2 g/dL — ABNORMAL LOW (ref 6.5–8.1)

## 2015-10-28 MED ORDER — HEPARIN SOD (PORK) LOCK FLUSH 100 UNIT/ML IV SOLN
INTRAVENOUS | Status: AC
  Filled 2015-10-28: qty 5

## 2015-10-28 MED ORDER — HEPARIN SOD (PORK) LOCK FLUSH 100 UNIT/ML IV SOLN
500.0000 [IU] | Freq: Once | INTRAVENOUS | Status: AC
Start: 2015-10-28 — End: 2015-10-28
  Administered 2015-10-28: 500 [IU] via INTRAVENOUS

## 2015-10-28 MED ORDER — DULOXETINE HCL 60 MG PO CPEP: 60.0000 mg | ORAL_CAPSULE | Freq: Every day | ORAL | 3 refills | Status: DC

## 2015-10-28 MED ORDER — SODIUM CHLORIDE 0.9% FLUSH
10.0000 mL | INTRAVENOUS | Status: DC | PRN
  Administered 2015-10-28: 10 mL via INTRAVENOUS
  Filled 2015-10-28: qty 10

## 2015-10-28 NOTE — Patient Instructions (Signed)
[place] at [facility] Discharge Instructions  RECOMMENDATIONS MADE BY THE CONSULTANT AND ANY TEST RESULTS WILL BE SENT TO YOUR REFERRING PHYSICIAN.  Port flush done with labs Follow up as scheduled  Thank you for choosing [place] at [facility] to provide your oncology and hematology care.  To afford each patient quality time with our provider, please arrive at least 15 minutes before your scheduled appointment time.   Beginning January 23rd 2017 lab work for the [facility] will be done in the  Main lab at [facility] on 1st floor. If you have a lab appointment with the [place] please come in thru the  Main Entrance and check in at the main information desk  You need to re-schedule your appointment should you arrive 10 or more minutes late.  We strive to give you quality time with our providers, and arriving late affects you and other patients whose appointments are after yours.  Also, if you no show three or more times for appointments you may be dismissed from the clinic at the providers discretion.     Again, thank you for choosing [facility].  Our hope is that these requests will decrease the amount of time that you wait before being seen by our physicians.       _____________________________________________________________  Should you have questions after your visit to [facility], please contact our office at [phone] between the hours of 8:30 a.m. and 4:30 p.m.  Voicemails left after 4:30 p.m. will not be returned until the following business day.  For prescription refill requests, have your pharmacy contact our office.         Resources For Cancer Patients and their Caregivers ? American Cancer Society: Can assist with transportation, wigs, general needs, runs Look Good Feel Better.        ([phone] ? Cancer Care: Provides financial assistance, online support groups,  medication/co-pay assistance.  1-800-813-HOPE 402-327-1569) ? [place] Assists [place] cancer patients and their families through emotional , educational and financial support.  3143666112 ? [place] DSS Where to apply for food stamps, Medicaid and utility assistance. 9470197879 ? RCATS: Transportation to medical appointments. [phone] ? Social Security Administration: May apply for disability if have a Stage IV cancer. [phone] 820-254-3379 ? [employer], Disability and Transit Services: Assists with nutrition, care and transit needs. [place] Support Programs: @10RELATIVEDAYS @ > Cancer Support Group  2nd Tuesday of the month 1pm-2pm, Journey Room  > Creative Journey  3rd Tuesday of the month 1130am-1pm, Journey Room  > Look Good Feel Better  1st Wednesday of the month 10am-12 noon, Journey Room (Call [place] to register [phone])

## 2015-10-28 NOTE — Progress Notes (Signed)
[facility] Hematology/Oncology Progress Note  Name: Janice Rogers      MRN: [id number]   Date: 10/28/2015 Time:7:10 PM   REFERRING PHYSICIAN:  Everardo All, MD (Medical Oncologist at [facility])  [place]:  Transfer of medical oncology care   DIAGNOSIS:  Clinically Stage IIIC Ovarian Cancer, Left    Ovarian cancer on left [facility])   08/22/2008 Procedure    Exploratory laparotomy, BSO, omentectomy, and bilateral pelvic and para-aortic lymph node dissection (optimally debulked)      10/22/2008 - 03/05/2009 Chemotherapy    GOG 252 Regimen II: Taxol 80 mg/m2 IV days 1, 8, and 15, IP Carboplatin AUC 6 day 1, and Avastin 15 mg/kg day 1 every 21 days x 6 cycles      03/13/2009 - 02/03/2010 Chemotherapy    GOG Regimen II: Maintenance Avastin 15 mg/kg x 16 additional cycles      08/26/2010 Progression    Recurrent disease documented      08/30/2010 - 06/01/2011 Chemotherapy    Gemcitabine 800 mg/m, carboplatin AUC 2 days 1, 8, 15 every 28 days.      07/28/2011 - 07/09/2013 Anti-estrogen oral therapy    Tamoxifen 20 mg daily      07/09/2013 Progression    Progression of disease noted on CT scan      07/10/2013 - 12/03/2013 Chemotherapy    Doxil 50 mg/m every 4 weeks 6 cycles      12/18/2014 Imaging    CT CAP- overall unchanged peritoneal metastatic disease. Multiple other findings are also unchanged from prior CTs. Stable disease.      01/12/2015 -  Anti-estrogen oral therapy    Femara 2.5 mg (Approximate start date)        HISTORY OF PRESENT ILLNESS:   Janice Rogers is a 82 y.o. female with a medical history significant for B12 deficiency, recurrent falls who is referred to the [facility] for transfer of her medical oncology care for clinical stage IIIC ovarian cancer, left.  Janice Rogers returns to the [place] today accompanied by her husband. She has a cane with her today.  When asked how she has been, she states  "Not good". "It is neuropathy I guess". She has trouble with her feet and has been falling often. She believes she has knocked something loose in her head. Her husband notes she hasn't had a hard fall in some time. She later states the neuropathy is worse than the cancer and doesn't understand why there isn't a cure.  This neuropathy has been present since chemotherapy and she has done therapy for her neuropathy. She has a walker and a wheelchair, but mostly uses her cane. The only time she uses the wheelchair is when she goes to [place]. Her husband says she doesn't use anything at home because she is close to hold onto things. "It's very hard to cook a meal or anything with the walker and I don't want to give that up". She has never had anyone come out and show her how to use her walker.      Her husband doesn't allow her to drive because of her neuropathy. She states, "I can drive better than I can walk".  She feels as though the neuropathy comes up her leg at times. She cannot recall if she had a nerve conduction study.  She attended the balance program at [facility]. She believes she has seen a neurologist.  She started  Cymbalta about a month or 5 weeks ago per her husband.  She eats "too good".   She continues to get annual mammograms.   She has received a flu shot this year.  She last saw Dr. Rhodia Albright at [facility] on 9/7 who manages her ovarian carcinoma. She is scheduled for repeat CT imaging in December.   Review of Systems  Constitutional: Negative.   HENT: Negative.   Eyes: Negative.   Respiratory: Negative.   Cardiovascular: Negative.   Gastrointestinal: Negative.   Genitourinary: Negative.   Musculoskeletal: Positive for falls.  Skin: Negative.   Neurological: Positive for tingling.       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   Psychiatric/Behavioral: Negative.   14 point review of systems was performed and is negative except as detailed under  history of present illness and above   PAST MEDICAL HISTORY:   Past Medical History:  Diagnosis Date  . B12 deficiency 08/05/2015  . Cancer ([place])   . Ovarian cancer on left ([place]) 08/05/2015    ALLERGIES: Allergies  Allergen Reactions  . Doxycycline Rash and Hives  . Gemcitabine Shortness Of Breath  . Penicillins Rash and Hives      MEDICATIONS: I have reviewed the patient's current medications.     PAST SURGICAL HISTORY Past Surgical History:  Procedure Laterality Date  . catarct surgery    . REPLACEMENT TOTAL KNEE    BSO, omentectomy, bilateral pelvic and para aortic LN dissection Breast biopsy X 2  FAMILY HISTORY: History reviewed. No pertinent family history. Cancer Mother  . Hearing loss Mother  . Vision loss Mother  . Arthritis Father  . Cancer Father  . Diabetes Sister  . Early death Brother    SOCIAL HISTORY:  reports that she has quit smoking. She has never used smokeless tobacco. She reports that she does not drink alcohol or use drugs.  Social History   Social History  . Marital status: Married    Spouse name: N/A  . Number of children: N/A  . Years of education: N/A   Social History Main Topics  . Smoking status: Former Research scientist (life sciences)  . Smokeless tobacco: Never Used  . Alcohol use No  . Drug use: No  . Sexual activity: No   Other Topics Concern  . None   Social History Narrative  . None  Smoking status: Former Smoker  Packs/day: 0.50  Years: 15.00  . Smokeless tobacco: Never Used  . Alcohol use Yes  Comment: very little  One daughter. No regular exercise. No pets.    PERFORMANCE STATUS: The patient's performance status is 1 - Symptomatic but completely ambulatory  PHYSICAL EXAM: Most Recent Vital Signs: Blood pressure (!) 158/71, pulse 72, temperature 97.7 F (36.5 C), temperature source Oral, resp. rate 16, weight 152 lb 9.6 oz (69.2 kg), SpO2 99 %. Physical Exam  Constitutional: She is oriented to person, place, and time and  well-developed, well-nourished, and in no distress.  Ambulates with use of cane Able to get on exam table with assistance  HENT:  Head: Normocephalic and atraumatic.  Mouth/Throat: Oropharynx is clear and moist.  Eyes: Conjunctivae and EOM are normal. Pupils are equal, round, and reactive to light.  Neck: Normal range of motion. Neck supple.  Cardiovascular: Normal rate, regular rhythm and normal heart sounds.   Pulmonary/Chest: Effort normal and breath sounds normal.  Abdominal: Soft. Bowel sounds are normal.  Musculoskeletal: Normal range of motion.  Neurological: She is alert and oriented to person,  place, and time. Gait normal.  Skin: Skin is warm and dry.  Nursing note and vitals reviewed.  LABORATORY DATA:  I have reviewed the data as listed.     Results for HARLENE, PETRALIA (MRN [id number])   Ref. Range 10/28/2015 14:56  Sodium Latest Ref Range: 135 - 145 mmol/L 139  Potassium Latest Ref Range: 3.5 - 5.1 mmol/L 2.8 (L)  Chloride Latest Ref Range: 101 - 111 mmol/L 111  CO2 Latest Ref Range: 22 - 32 mmol/L 22  BUN Latest Ref Range: 6 - 20 mg/dL 16  Creatinine Latest Ref Range: 0.44 - 1.00 mg/dL 0.83  Calcium Latest Ref Range: 8.9 - 10.3 mg/dL 7.7 (L)  EGFR (Non-African Amer.) Latest Ref Range: >60 mL/min >60  EGFR (African American) Latest Ref Range: >60 mL/min >60  Glucose Latest Ref Range: 65 - 99 mg/dL 87  Anion gap Latest Ref Range: 5 - 15  6  Alkaline Phosphatase Latest Ref Range: 38 - 126 U/L 48  Albumin Latest Ref Range: 3.5 - 5.0 g/dL 3.4 (L)  AST Latest Ref Range: 15 - 41 U/L 14 (L)  ALT Latest Ref Range: 14 - 54 U/L 9 (L)  Total Protein Latest Ref Range: 6.5 - 8.1 g/dL 6.2 (L)  Total Bilirubin Latest Ref Range: 0.3 - 1.2 mg/dL 0.8  WBC Latest Ref Range: 4.0 - 10.5 K/uL 7.2  RBC Latest Ref Range: 3.87 - 5.11 MIL/uL 4.16  Hemoglobin Latest Ref Range: 12.0 - 15.0 g/dL 12.0  HCT Latest Ref Range: 36.0 - 46.0 % 36.4  MCV Latest Ref Range: 78.0 - 100.0 fL  87.5  MCH Latest Ref Range: 26.0 - 34.0 pg 28.8  MCHC Latest Ref Range: 30.0 - 36.0 g/dL 33.0  RDW Latest Ref Range: 11.5 - 15.5 % 14.0  Platelets Latest Ref Range: 150 - 400 K/uL 154  Neutrophils Latest Units: % 72  Lymphocytes Latest Units: % 20  Monocytes Relative Latest Units: % 6  Eosinophil Latest Units: % 2  Basophil Latest Units: % 0  NEUT# Latest Ref Range: 1.7 - 7.7 K/uL 5.1  Lymphocyte # Latest Ref Range: 0.7 - 4.0 K/uL 1.4  Monocyte # Latest Ref Range: 0.1 - 1.0 K/uL 0.4  Eosinophils Absolute Latest Ref Range: 0.0 - 0.7 K/uL 0.2  Basophils Absolute Latest Ref Range: 0.0 - 0.1 K/uL 0.0    RADIOGRAPHY: I have personally reviewed the radiological images as listed and agreed with the findings in the report.    PATHOLOGY:  N/A   ASSESSMENT/PLAN:   Ovarian cancer on left [facility]) Indwelling Port a cath Chemotherapy induced neuropathy [place] Aromatase inhibitor use  Clinical stage IIIC LEFT Ovarian cancer treated surgically on 08/22/2008 followed by 6 cycles of Carboplatin/Paclitaxel?Avastin in accordance with GOG 252- regimen II) finishing in March 2011 followed by Avastin maintence untile recurrent disease was documented on 08/30/2010 at which time she was treated with Carboplatin/Gemcitabine finishing in July 2013 with transition to Tamoxifen 20 mg daily (07/28/2011- 07/09/2013), then Doxil x 6 cycles (07/10/2013- 12/03/2013).  Currently on Letrozole daily since ~ Jan 2017.  Port flush is due today.  This will be completed.  Labs today. CBC diff, CMET, CA 125.  She is to continue follow-up with Dr. Rhodia Albright at [facility] as scheduled in December. She will undergo repeat imaging at that time.   [facility]. Realistically she needs to use her walker at home and when she is out but she refuses. I have increased her  cymbalta to 60 mg daily. Prescription called into [facility].   Return in 3 months for follow-up.    B12 deficiency  B12 deficiency, B12 1000 mcg  injections are being administered locally by her pharmacy.  Continue with B12 as ordered.   ORDERS PLACED FOR THIS ENCOUNTER:  Meds ordered this encounter  Medications  . DULoxetine (CYMBALTA) 60 MG capsule    Sig: Take 1 capsule (60 mg total) by mouth daily.    Dispense:  30 capsule    Refill:  3    All questions were answered. The patient knows to call the clinic with any problems, questions or concerns. We can certainly see the patient much sooner if necessary.  This document serves as a record of services personally performed by Ancil Linsey, MD. It was created on her behalf by Arlyce Harman, a trained medical scribe. The creation of this record is based on the scribe's personal observations and the provider's statements to them. This document has been checked and approved by the attending provider.  I have reviewed the above documentation for accuracy and completeness and I agree with the above.  This note is electronically signed JO:[email or web address] Cyril Mourning, MD  10/28/2015 7:10 PM

## 2015-10-28 NOTE — Progress Notes (Signed)
Janice Rogers presented for Portacath access and flush. Portacath located right chest wall accessed with  H 20 needle. Good blood return present. Portacath flushed with 14ml NS and 500U/52ml Heparin and needle removed intact. Procedure without incident. Patient tolerated procedure well.

## 2015-10-28 NOTE — Patient Instructions (Addendum)
[place] at [facility] Discharge Instructions  RECOMMENDATIONS MADE BY THE CONSULTANT AND ANY TEST RESULTS WILL BE SENT TO YOUR REFERRING PHYSICIAN.  You saw Dr.Penland today. You had port flushed today with labs drawn. Continue port flushes. Follow up in 3 months-same day as port flush. Physical Therapy at [facility]. Information and prescription given to you. Please call to set up. Cymbalta increased from 30 mg to 60 mg. Prescription sent to [facility]. See Amy at checkout for appointments.  Thank you for choosing [place] at [facility] to provide your oncology and hematology care.  To afford each patient quality time with our provider, please arrive at least 15 minutes before your scheduled appointment time.   Beginning January 23rd 2017 lab work for the [facility] will be done in the  Main lab at [facility] on 1st floor. If you have a lab appointment with the [place] please come in thru the  Main Entrance and check in at the main information desk  You need to re-schedule your appointment should you arrive 10 or more minutes late.  We strive to give you quality time with our providers, and arriving late affects you and other patients whose appointments are after yours.  Also, if you no show three or more times for appointments you may be dismissed from the clinic at the providers discretion.     Again, thank you for choosing [facility].  Our hope is that these requests will decrease the amount of time that you wait before being seen by our physicians.       _____________________________________________________________  Should you have questions after your visit to [facility], please contact our office at [phone] between the hours of 8:30 a.m. and 4:30 p.m.  Voicemails left after 4:30 p.m. will not be returned until the following business day.  For prescription refill requests, have your  pharmacy contact our office.         Resources For Cancer Patients and their Caregivers ? American Cancer Society: Can assist with transportation, wigs, general needs, runs Look Good Feel Better.        817-508-2937 ? Cancer Care: Provides financial assistance, online support groups, medication/co-pay assistance.  1-800-813-HOPE 220-478-3251) ? [place] Assists [place] cancer patients and their families through emotional , educational and financial support.  [phone] ? [place] DSS Where to apply for food stamps, Medicaid and utility assistance. 928-537-8559 ? RCATS: Transportation to medical appointments. 808-473-3854 ? Social Security Administration: May apply for disability if have a Stage IV cancer. 619-134-7803 [phone] ? [employer], Disability and Transit Services: Assists with nutrition, care and transit needs. [place] Support Programs: @10RELATIVEDAYS @ > Cancer Support Group  2nd Tuesday of the month 1pm-2pm, Journey Room  > Creative Journey  3rd Tuesday of the month 1130am-1pm, Journey Room  > Look Good Feel Better  1st Wednesday of the month 10am-12 noon, Journey Room (Call [place] to register [phone])

## 2015-10-29 ENCOUNTER — Encounter (HOSPITAL_COMMUNITY): Payer: Self-pay | Admitting: Hematology & Oncology

## 2015-10-29 ENCOUNTER — Other Ambulatory Visit (HOSPITAL_COMMUNITY): Payer: Self-pay | Admitting: Oncology

## 2015-10-29 DIAGNOSIS — E876 Hypokalemia: Secondary | ICD-10-CM

## 2015-10-29 LAB — CA 125: CA 125: 30.1 U/mL (ref 0.0–38.1)

## 2015-10-29 MED ORDER — POTASSIUM CHLORIDE CRYS ER 20 MEQ PO TBCR: 20.0000 meq | EXTENDED_RELEASE_TABLET | Freq: Three times a day (TID) | ORAL | 0 refills | Status: DC

## 2015-11-10 ENCOUNTER — Other Ambulatory Visit (HOSPITAL_COMMUNITY): Payer: Self-pay | Admitting: Oncology

## 2015-11-10 ENCOUNTER — Encounter (HOSPITAL_COMMUNITY): Payer: Medicare Other | Attending: Hematology & Oncology

## 2015-11-10 DIAGNOSIS — E876 Hypokalemia: Secondary | ICD-10-CM

## 2015-11-10 LAB — POTASSIUM: Potassium: 4.2 mmol/L (ref 3.5–5.1)

## 2015-11-10 MED ORDER — POTASSIUM CHLORIDE CRYS ER 20 MEQ PO TBCR: 20.0000 meq | EXTENDED_RELEASE_TABLET | Freq: Every day | ORAL | 1 refills | Status: DC

## 2015-12-07 ENCOUNTER — Other Ambulatory Visit (HOSPITAL_COMMUNITY): Payer: Self-pay

## 2015-12-07 ENCOUNTER — Telehealth (HOSPITAL_COMMUNITY): Payer: Self-pay | Admitting: *Deleted

## 2015-12-07 DIAGNOSIS — E876 Hypokalemia: Secondary | ICD-10-CM

## 2015-12-07 MED ORDER — POTASSIUM CHLORIDE CRYS ER 20 MEQ PO TBCR: 20.0000 meq | EXTENDED_RELEASE_TABLET | Freq: Every day | ORAL | 1 refills | Status: DC

## 2015-12-07 NOTE — Telephone Encounter (Signed)
Was able to reach husband and let him know wife does need to continue potassium. He verbalized understanding. Refilled potassium since husband says she is almost out.

## 2015-12-07 NOTE — Telephone Encounter (Signed)
Husband stated patient needs refill on potassium. Chart checked and refilled.

## 2015-12-07 NOTE — Telephone Encounter (Signed)
Attempted to call several times at number listed. Unable to reach so far. She needs to continue her potassium.

## 2015-12-23 ENCOUNTER — Encounter (HOSPITAL_COMMUNITY): Payer: Self-pay

## 2016-01-05 ENCOUNTER — Encounter (HOSPITAL_COMMUNITY): Payer: Self-pay

## 2016-01-05 ENCOUNTER — Encounter (HOSPITAL_COMMUNITY): Payer: Medicare Other | Attending: Oncology

## 2016-01-05 DIAGNOSIS — Z452 Encounter for adjustment and management of vascular access device: Secondary | ICD-10-CM

## 2016-01-05 DIAGNOSIS — E538 Deficiency of other specified B group vitamins: Secondary | ICD-10-CM | POA: Insufficient documentation

## 2016-01-05 DIAGNOSIS — Z95828 Presence of other vascular implants and grafts: Secondary | ICD-10-CM | POA: Insufficient documentation

## 2016-01-05 DIAGNOSIS — C562 Malignant neoplasm of left ovary: Secondary | ICD-10-CM

## 2016-01-05 MED ORDER — HEPARIN SOD (PORK) LOCK FLUSH 100 UNIT/ML IV SOLN
500.0000 [IU] | Freq: Once | INTRAVENOUS | Status: AC
  Administered 2016-01-05: 500 [IU] via INTRAVENOUS

## 2016-01-05 MED ORDER — SODIUM CHLORIDE 0.9% FLUSH
10.0000 mL | INTRAVENOUS | Status: DC | PRN
  Administered 2016-01-05: 10 mL via INTRAVENOUS
  Filled 2016-01-05: qty 10

## 2016-01-05 NOTE — Progress Notes (Signed)
Chelly L Wilbon presented for Portacath access and flush.  Portacath located right chest wall accessed with  H 20 needle.  Good blood return present. Portacath flushed with 20ml NS and 500U/5ml Heparin and needle removed intact.  Procedure tolerated well and without incident.   

## 2016-01-05 NOTE — Patient Instructions (Signed)
[facility] at [facility] Discharge Instructions  RECOMMENDATIONS MADE BY THE CONSULTANT AND ANY TEST RESULTS WILL BE SENT TO YOUR REFERRING PHYSICIAN.  Port flush today. Return as scheduled for port flushes.  Return as scheduled for office visit.  Thank you for choosing [facility] at [facility] to provide your oncology and hematology care.  To afford each patient quality time with our provider, please arrive at least 15 minutes before your scheduled appointment time.    If you have a lab appointment with the Cancer Center please come in thru the  Main Entrance and check in at the main information desk  You need to re-schedule your appointment should you arrive 10 or more minutes late.  We strive to give you quality time with our providers, and arriving late affects you and other patients whose appointments are after yours.  Also, if you no show three or more times for appointments you may be dismissed from the clinic at the providers discretion.     Again, thank you for choosing [facility].  Our hope is that these requests will decrease the amount of time that you wait before being seen by our physicians.       _____________________________________________________________  Should you have questions after your visit to  Cancer Center, please contact our office at [phone] between the hours of 8:30 a.m. and 4:30 p.m.  Voicemails left after 4:30 p.m. will not be returned until the following business day.  For prescription refill requests, have your pharmacy contact our office.       Resources For Cancer Patients and their Caregivers ? American Cancer Society: Can assist with transportation, wigs, general needs, runs Look Good Feel Better.        1-888-227-6333 ? Cancer Care: Provides financial assistance, online support groups, medication/co-pay assistance.  1-800-813-HOPE (4673) ? [facility] Assists [place] cancer patients and their families through emotional , educational and financial support.  336-427-4357 ? [place] DSS Where to apply for food stamps, Medicaid and utility assistance. 336-342-1394 ? RCATS: Transportation to medical appointments. 336-347-2287 ? Social Security Administration: May apply for disability if have a Stage IV cancer. 336-342-7796 1-800-772-1213 ? [place] Aging, Disability and Transit Services: Assists with nutrition, care and transit needs. 336-349-2343  Cancer Center Support Programs: @10RELATIVEDAYS@ > Cancer Support Group  2nd Tuesday of the month 1pm-2pm, Journey Room  > Creative Journey  3rd Tuesday of the month 1130am-1pm, Journey Room  > Look Good Feel Better  1st Wednesday of the month 10am-12 noon, Journey Room (Call American Cancer Society to register 1-800-395-5775)   

## 2016-01-28 ENCOUNTER — Encounter (HOSPITAL_BASED_OUTPATIENT_CLINIC_OR_DEPARTMENT_OTHER): Payer: Medicare Other | Admitting: Oncology

## 2016-01-28 ENCOUNTER — Encounter (HOSPITAL_COMMUNITY): Payer: Self-pay | Admitting: Oncology

## 2016-01-28 ENCOUNTER — Encounter (HOSPITAL_COMMUNITY): Payer: Self-pay

## 2016-01-28 VITALS — BP 132/73 | HR 96 | Temp 98.2°F | Resp 20 | Wt 146.0 lb

## 2016-01-28 DIAGNOSIS — C562 Malignant neoplasm of left ovary: Secondary | ICD-10-CM | POA: Diagnosis not present

## 2016-01-28 DIAGNOSIS — E538 Deficiency of other specified B group vitamins: Secondary | ICD-10-CM

## 2016-01-28 NOTE — Patient Instructions (Signed)
[place] at [facility] Discharge Instructions  RECOMMENDATIONS MADE BY THE CONSULTANT AND ANY TEST RESULTS WILL BE SENT TO YOUR REFERRING PHYSICIAN.  You were seen today by Kirby Crigler PA-C. Continue taking Letrozole. Return in 4 weeks for labs and port flush. Follow up with Dr. Rhodia Albright. Return in 3 months for follow up.   Thank you for choosing [place] at [facility] to provide your oncology and hematology care.  To afford each patient quality time with our provider, please arrive at least 15 minutes before your scheduled appointment time.    If you have a lab appointment with the [facility] please come in thru the  Main Entrance and check in at the main information desk  You need to re-schedule your appointment should you arrive 10 or more minutes late.  We strive to give you quality time with our providers, and arriving late affects you and other patients whose appointments are after yours.  Also, if you no show three or more times for appointments you may be dismissed from the clinic at the providers discretion.     Again, thank you for choosing [facility].  Our hope is that these requests will decrease the amount of time that you wait before being seen by our physicians.       _____________________________________________________________  Should you have questions after your visit to [facility], please contact our office at [phone] between the hours of 8:30 a.m. and 4:30 p.m.  Voicemails left after 4:30 p.m. will not be returned until the following business day.  For prescription refill requests, have your pharmacy contact our office.       Resources For Cancer Patients and their Caregivers ? American Cancer Society: Can assist with transportation, wigs, general needs, runs Look Good Feel Better.        276-208-2940 ? Cancer Care: Provides financial assistance, online support groups,  medication/co-pay assistance.  1-800-813-HOPE (678) 744-6285) ? [place] Assists [place] cancer patients and their families through emotional , educational and financial support.  661-552-1152 ? [place] DSS Where to apply for food stamps, Medicaid and utility assistance. 845-034-8085 ? RCATS: Transportation to medical appointments. 508-519-8373 ? Social Security Administration: May apply for disability if have a Stage IV cancer. 629-604-4261 605-221-5099 ? [employer], Disability and Transit Services: Assists with nutrition, care and transit needs. [place] Support Programs: @10RELATIVEDAYS @ > Cancer Support Group  2nd Tuesday of the month 1pm-2pm, Journey Room  > Creative Journey  3rd Tuesday of the month 1130am-1pm, Journey Room  > Look Good Feel Better  1st Wednesday of the month 10am-12 noon, Journey Room (Call [place] to register [phone])

## 2016-01-28 NOTE — Progress Notes (Signed)
Janice Burly, MD [place] [id number]  Ovarian cancer on left [facility]) - Plan: CBC with Differential, Comprehensive metabolic panel, CA 0000000, CBC with Differential, Comprehensive metabolic panel, CA 0000000  123456 deficiency  CURRENT THERAPY: Letrozole daily with ongoing Gyn Onc follow-up with Dr. Rhodia Albright at [facility].  INTERVAL HISTORY: Janice Rogers 83 y.o. female returns for followup of Clinical stage IIIC LEFT Ovarian cancer treated surgically on 08/22/2008 followed by 6 cycles of Carboplatin/Paclitaxel?Avastin in accordance with GOG 252- regimen II) finishing in March 2011 followed by Avastin maintence untile recurrent disease was documented on 08/30/2010 at which time she was treated with Carboplatin/Gemcitabine finishing in July 2013 with transition to Tamoxifen 20 mg daily (07/28/2011- 07/09/2013), then Doxil x 6 cycles (07/10/2013- 12/03/2013).  Currently on Letrozole daily since ~ Jan 2017.     Ovarian cancer on left [facility])   08/22/2008 Procedure    Exploratory laparotomy, BSO, omentectomy, and bilateral pelvic and para-aortic lymph node dissection (optimally debulked)      10/22/2008 - 03/05/2009 Chemotherapy    GOG 252 Regimen II: Taxol 80 mg/m2 IV days 1, 8, and 15, IP Carboplatin AUC 6 day 1, and Avastin 15 mg/kg day 1 every 21 days x 6 cycles      03/13/2009 - 02/03/2010 Chemotherapy    GOG Regimen II: Maintenance Avastin 15 mg/kg x 16 additional cycles      08/26/2010 Progression    Recurrent disease documented      08/30/2010 - 06/01/2011 Chemotherapy    Gemcitabine 800 mg/m, carboplatin AUC 2 days 1, 8, 15 every 28 days.      07/28/2011 - 07/09/2013 Anti-estrogen oral therapy    Tamoxifen 20 mg daily      07/09/2013 Progression    Progression of disease noted on CT scan      07/10/2013 - 12/03/2013 Chemotherapy    Doxil 50 mg/m every 4 weeks 6 cycles      12/18/2014 Imaging    CT CAP- overall unchanged peritoneal metastatic disease. Multiple other  findings are also unchanged from prior CTs. Stable disease.      01/12/2015 -  Anti-estrogen oral therapy    Femara 2.5 mg (Approximate start date)       Neuropathy and balance issues continue to be her biggest complaint.  She is working with PT [facility]) for this issue.  She notes that it is very difficult for her, but she is certainly trying.  Her husband reports ongoing issues with falls.  She is wearing a protective hat.    She continues to follow with Dr. Rhodia Albright and she saw him in December 2017.  She is scheduled for a return appointment at [facility] in June 2018.  She was advised to continue with Letrozole.  She has difficulty swallowing Kdur pills.  For now, she will hold this medication.  Will follow-up with K+ levels at next lab check.  Review of Systems  Constitutional: Negative.  Negative for chills and fever.  HENT: Negative.   Eyes: Negative.   Respiratory: Negative.  Negative for cough.   Cardiovascular: Negative.  Negative for chest pain.  Gastrointestinal: Negative.  Negative for constipation, diarrhea, nausea and vomiting.  Genitourinary: Negative.   Musculoskeletal: Positive for falls.  Skin: Negative.   Neurological: Positive for sensory change. Negative for weakness.  Endo/Heme/Allergies: Negative.   Psychiatric/Behavioral: Negative.     Past Medical History:  Diagnosis Date  . B12 deficiency 08/05/2015  . Cancer (  [place])   . Ovarian cancer on left ([place]) 08/05/2015    Past Surgical History:  Procedure Laterality Date  . catarct surgery    . REPLACEMENT TOTAL KNEE      History reviewed. No pertinent family history.  Social History   Social History  . Marital status: Married    Spouse name: N/A  . Number of children: N/A  . Years of education: N/A   Social History Main Topics  . Smoking status: Former Research scientist (life sciences)  . Smokeless tobacco: Never Used  . Alcohol use No  . Drug use: No  . Sexual activity: No   Other Topics Concern  . None   Social  History Narrative  . None     PHYSICAL EXAMINATION  ECOG PERFORMANCE STATUS: 2 - Symptomatic, <50% confined to bed  Vitals:   01/28/16 1342  BP: 132/73  Pulse: 96  Resp: 20  Temp: 98.2 F (36.8 C)    GENERAL:alert, no distress, well nourished, well developed, comfortable, cooperative and in wheelchair, accompanied by husband. SKIN: skin color, texture, turgor are normal, no rashes or significant lesions HEAD: Normocephalic, No masses, lesions, tenderness or abnormalities.  Protective hat in place. EYES: normal, EOMI, Conjunctiva are pink and non-injected EARS: External ears normal OROPHARYNX:lips, buccal mucosa, and tongue normal and mucous membranes are moist  NECK: supple, no adenopathy, trachea midline LYMPH:  no palpable lymphadenopathy BREAST:not examined LUNGS: clear to auscultation  HEART: regular rate & rhythm, no murmurs and no gallops ABDOMEN:abdomen soft and normal bowel sounds BACK: Back symmetric, no curvature. EXTREMITIES:less then 2 second capillary refill, no joint deformities, effusion, or inflammation, no skin discoloration, no cyanosis  NEURO: alert & oriented x 3 with fluent speech, no focal motor/sensory deficits, in wheelchair   LABORATORY DATA: CBC    Component Value Date/Time   WBC 7.2 10/28/2015 1456   RBC 4.16 10/28/2015 1456   HGB 12.0 10/28/2015 1456   HCT 36.4 10/28/2015 1456   PLT 154 10/28/2015 1456   MCV 87.5 10/28/2015 1456   MCH 28.8 10/28/2015 1456   MCHC 33.0 10/28/2015 1456   RDW 14.0 10/28/2015 1456   LYMPHSABS 1.4 10/28/2015 1456   MONOABS 0.4 10/28/2015 1456   EOSABS 0.2 10/28/2015 1456   BASOSABS 0.0 10/28/2015 1456      Chemistry      Component Value Date/Time   NA 139 10/28/2015 1456   K 4.2 11/10/2015 1147   CL 111 10/28/2015 1456   CO2 22 10/28/2015 1456   BUN 16 10/28/2015 1456   CREATININE 0.83 10/28/2015 1456      Component Value Date/Time   CALCIUM 7.7 (L) 10/28/2015 1456   ALKPHOS 48 10/28/2015 1456    AST 14 (L) 10/28/2015 1456   ALT 9 (L) 10/28/2015 1456   BILITOT 0.8 10/28/2015 1456        PENDING LABS:   RADIOGRAPHIC STUDIES:  No results found.   PATHOLOGY:    ASSESSMENT AND PLAN:  Ovarian cancer on left [facility]) Clinical stage IIIC LEFT Ovarian cancer treated surgically on 08/22/2008 followed by 6 cycles of Carboplatin/Paclitaxel?Avastin in accordance with GOG 252- regimen II) finishing in March 2011 followed by Avastin maintence untile recurrent disease was documented on 08/30/2010 at which time she was treated with Carboplatin/Gemcitabine finishing in July 2013 with transition to Tamoxifen 20 mg daily (07/28/2011- 07/09/2013), then Doxil x 6 cycles (07/10/2013- 12/03/2013).  Currently on Letrozole daily since ~ Jan 2017.  Oncology history is up to date.    Labs in 4  weeks with her next port flush: CBC diff, CMET, CA 125.  I personally reviewed and went over laboratory results with the patient.  The results are noted within this dictation.  K+ replacement therapy is difficult for the patient given the size of the tablet.  For NOW, she will HOLD Kdur and we will see what her K+ does with her next lab check in Feb 2018.  Labs in 3 months: CBC diff, CMET, CA 125.  Continue with port flushes every 6-8 weeks.  She saw Dr. Rhodia Albright (Gyn Onc) at [facility] on 12/10/2015: PLAN:  I discussed findings with patient and have recommended continuing of Femara 2.5 mg daily. Follow-up in 6 months or as needed.  She is working with [employer] PT.  I have reviewed the recommended plan and signed the order today.  Return in 3 months for follow-up.    B12 deficiency B12 deficiency, B12 1000 mcg injections are being administered locally by her pharmacy.   ORDERS PLACED FOR THIS ENCOUNTER: Orders Placed This Encounter  Procedures  . CBC with Differential  . Comprehensive metabolic panel  . CA 125  . CBC with Differential  . Comprehensive metabolic panel  . CA 125    MEDICATIONS PRESCRIBED  THIS ENCOUNTER: No orders of the defined types were placed in this encounter.   THERAPY PLAN:  Continue with Letrozole as directed.    All questions were answered. The patient knows to call the clinic with any problems, questions or concerns. We can certainly see the patient much sooner if necessary.  Patient and plan discussed with Dr. Ancil Linsey and she is in agreement with the aforementioned.   This note is electronically signed by: Doy Mince 01/28/2016 1:52 PM

## 2016-01-28 NOTE — Assessment & Plan Note (Signed)
B12 deficiency, B12 1000 mcg injections are being administered locally by her pharmacy.

## 2016-01-28 NOTE — Assessment & Plan Note (Addendum)
Clinical stage IIIC LEFT Ovarian cancer treated surgically on 08/22/2008 followed by 6 cycles of Carboplatin/Paclitaxel?Avastin in accordance with GOG 252- regimen II) finishing in March 2011 followed by Avastin maintence untile recurrent disease was documented on 08/30/2010 at which time she was treated with Carboplatin/Gemcitabine finishing in July 2013 with transition to Tamoxifen 20 mg daily (07/28/2011- 07/09/2013), then Doxil x 6 cycles (07/10/2013- 12/03/2013).  Currently on Letrozole daily since ~ Jan 2017.  Oncology history is up to date.    Labs in 4 weeks with her next port flush: CBC diff, CMET, CA 125.  I personally reviewed and went over laboratory results with the patient.  The results are noted within this dictation.  K+ replacement therapy is difficult for the patient given the size of the tablet.  For NOW, she will HOLD Kdur and we will see what her K+ does with her next lab check in Feb 2018.  Labs in 3 months: CBC diff, CMET, CA 125.  Continue with port flushes every 6-8 weeks.  She saw Dr. Rhodia Albright (Gyn Onc) at [facility] on 12/10/2015: PLAN:  I discussed findings with patient and have recommended continuing of Femara 2.5 mg daily. Follow-up in 6 months or as needed.  She is working with [facility] [employer] PT.  I have reviewed the recommended plan and signed the order today.  Return in 3 months for follow-up.

## 2016-03-01 ENCOUNTER — Encounter (HOSPITAL_COMMUNITY): Payer: Self-pay | Attending: Oncology

## 2016-03-01 ENCOUNTER — Other Ambulatory Visit (HOSPITAL_COMMUNITY): Payer: Self-pay

## 2016-03-01 DIAGNOSIS — C562 Malignant neoplasm of left ovary: Secondary | ICD-10-CM | POA: Insufficient documentation

## 2016-03-01 LAB — CBC WITH DIFFERENTIAL/PLATELET
BASOS PCT: 0 %
Basophils Absolute: 0 10*3/uL (ref 0.0–0.1)
EOS ABS: 0.2 10*3/uL (ref 0.0–0.7)
Eosinophils Relative: 2 %
HCT: 42.8 % (ref 36.0–46.0)
HEMOGLOBIN: 14.1 g/dL (ref 12.0–15.0)
LYMPHS ABS: 1.3 10*3/uL (ref 0.7–4.0)
Lymphocytes Relative: 17 %
MCH: 28.7 pg (ref 26.0–34.0)
MCHC: 32.9 g/dL (ref 30.0–36.0)
MCV: 87.2 fL (ref 78.0–100.0)
MONO ABS: 0.4 10*3/uL (ref 0.1–1.0)
MONOS PCT: 6 %
NEUTROS PCT: 75 %
Neutro Abs: 5.7 10*3/uL (ref 1.7–7.7)
Platelets: 193 10*3/uL (ref 150–400)
RBC: 4.91 MIL/uL (ref 3.87–5.11)
RDW: 14.4 % (ref 11.5–15.5)
WBC: 7.6 10*3/uL (ref 4.0–10.5)

## 2016-03-01 LAB — COMPREHENSIVE METABOLIC PANEL
ALBUMIN: 4.1 g/dL (ref 3.5–5.0)
ALK PHOS: 63 U/L (ref 38–126)
ALT: 11 U/L — ABNORMAL LOW (ref 14–54)
ANION GAP: 9 (ref 5–15)
AST: 19 U/L (ref 15–41)
BILIRUBIN TOTAL: 0.7 mg/dL (ref 0.3–1.2)
BUN: 17 mg/dL (ref 6–20)
CALCIUM: 9.7 mg/dL (ref 8.9–10.3)
CO2: 28 mmol/L (ref 22–32)
Chloride: 101 mmol/L (ref 101–111)
Creatinine, Ser: 1.07 mg/dL — ABNORMAL HIGH (ref 0.44–1.00)
GFR calc non Af Amer: 46 mL/min — ABNORMAL LOW (ref >60)
GFR, EST AFRICAN AMERICAN: 53 mL/min — AB (ref >60)
Glucose, Bld: 116 mg/dL — ABNORMAL HIGH (ref 65–99)
POTASSIUM: 3.5 mmol/L (ref 3.5–5.1)
SODIUM: 138 mmol/L (ref 135–145)
TOTAL PROTEIN: 7.6 g/dL (ref 6.5–8.1)

## 2016-03-01 MED ORDER — HEPARIN SOD (PORK) LOCK FLUSH 100 UNIT/ML IV SOLN
500.0000 [IU] | Freq: Once | INTRAVENOUS | Status: AC
  Administered 2016-03-01: 500 [IU] via INTRAVENOUS

## 2016-03-01 MED ORDER — SODIUM CHLORIDE 0.9% FLUSH
20.0000 mL | INTRAVENOUS | Status: DC | PRN
  Administered 2016-03-01: 20 mL via INTRAVENOUS
  Filled 2016-03-01: qty 20

## 2016-03-01 NOTE — Progress Notes (Signed)
Janice Rogers tolerated port flush with lab draw well without complaints or incident. Port accessed with 20 gauge needle with blood drawn for labs then flushed with 20 ml NS and 5 ml Heparin easily per protocol then de-accessed. VSS Pt discharged via wheelchair in satisfactory condition accompanied by her husband

## 2016-03-01 NOTE — Patient Instructions (Signed)
[place] at [facility] Discharge Instructions  RECOMMENDATIONS MADE BY THE CONSULTANT AND ANY TEST RESULTS WILL BE SENT TO YOUR REFERRING PHYSICIAN.  Portacath flushed with labs drawn today. Follow-up as scheduled. Call clinic for any questions or concerns  Thank you for choosing [place] at [facility] to provide your oncology and hematology care.  To afford each patient quality time with our provider, please arrive at least 15 minutes before your scheduled appointment time.    If you have a lab appointment with the [facility] please come in thru the  Main Entrance and check in at the main information desk  You need to re-schedule your appointment should you arrive 10 or more minutes late.  We strive to give you quality time with our providers, and arriving late affects you and other patients whose appointments are after yours.  Also, if you no show three or more times for appointments you may be dismissed from the clinic at the providers discretion.     Again, thank you for choosing [facility].  Our hope is that these requests will decrease the amount of time that you wait before being seen by our physicians.       _____________________________________________________________  Should you have questions after your visit to [facility], please contact our office at [phone] between the hours of 8:30 a.m. and 4:30 p.m.  Voicemails left after 4:30 p.m. will not be returned until the following business day.  For prescription refill requests, have your pharmacy contact our office.       Resources For Cancer Patients and their Caregivers ? American Cancer Society: Can assist with transportation, wigs, general needs, runs Look Good Feel Better.        856 758 3020 ? Cancer Care: Provides financial assistance, online support groups, medication/co-pay assistance.  1-800-813-HOPE 239-035-3350) ? [facility] Assists [place] cancer patients and their families through emotional , educational and financial support.  937 762 3657 ? [place] DSS Where to apply for food stamps, Medicaid and utility assistance. 561-195-3026 ? RCATS: Transportation to medical appointments. (901)276-8337 ? Social Security Administration: May apply for disability if have a Stage IV cancer. ([phone] 236-072-8894 ? [employer], Disability and Transit Services: Assists with nutrition, care and transit needs. [place] Support Programs: @10RELATIVEDAYS @ > Cancer Support Group  2nd Tuesday of the month 1pm-2pm, Journey Room  > Creative Journey  3rd Tuesday of the month 1130am-1pm, Journey Room  > Look Good Feel Better  1st Wednesday of the month 10am-12 noon, Journey Room (Call [place] to register [phone])

## 2016-03-02 ENCOUNTER — Other Ambulatory Visit (HOSPITAL_COMMUNITY): Payer: Self-pay | Admitting: Oncology

## 2016-03-02 LAB — CA 125: CA 125: 32.6 U/mL (ref 0.0–38.1)

## 2016-03-14 ENCOUNTER — Other Ambulatory Visit (HOSPITAL_COMMUNITY): Payer: Self-pay | Admitting: Hematology & Oncology

## 2016-03-14 DIAGNOSIS — G620 Drug-induced polyneuropathy: Secondary | ICD-10-CM

## 2016-03-14 DIAGNOSIS — T451X5A Adverse effect of antineoplastic and immunosuppressive drugs, initial encounter: Principal | ICD-10-CM

## 2016-04-27 ENCOUNTER — Encounter (HOSPITAL_BASED_OUTPATIENT_CLINIC_OR_DEPARTMENT_OTHER): Payer: Self-pay

## 2016-04-27 ENCOUNTER — Encounter (HOSPITAL_COMMUNITY): Payer: Self-pay | Admitting: Oncology

## 2016-04-27 ENCOUNTER — Encounter (HOSPITAL_COMMUNITY): Payer: Medicare Other | Attending: Oncology | Admitting: Oncology

## 2016-04-27 VITALS — BP 136/81 | HR 87 | Resp 18 | Ht 63.0 in | Wt 143.0 lb

## 2016-04-27 DIAGNOSIS — T451X5A Adverse effect of antineoplastic and immunosuppressive drugs, initial encounter: Secondary | ICD-10-CM | POA: Insufficient documentation

## 2016-04-27 DIAGNOSIS — G620 Drug-induced polyneuropathy: Secondary | ICD-10-CM | POA: Insufficient documentation

## 2016-04-27 DIAGNOSIS — Z79811 Long term (current) use of aromatase inhibitors: Secondary | ICD-10-CM

## 2016-04-27 DIAGNOSIS — C562 Malignant neoplasm of left ovary: Secondary | ICD-10-CM

## 2016-04-27 DIAGNOSIS — R634 Abnormal weight loss: Secondary | ICD-10-CM | POA: Diagnosis not present

## 2016-04-27 DIAGNOSIS — E538 Deficiency of other specified B group vitamins: Secondary | ICD-10-CM | POA: Diagnosis not present

## 2016-04-27 HISTORY — DX: Drug-induced polyneuropathy: G62.0

## 2016-04-27 HISTORY — DX: Drug-induced polyneuropathy: T45.1X5A

## 2016-04-27 LAB — COMPREHENSIVE METABOLIC PANEL
ALT: 10 U/L — ABNORMAL LOW (ref 14–54)
AST: 18 U/L (ref 15–41)
Albumin: 3.9 g/dL (ref 3.5–5.0)
Alkaline Phosphatase: 70 U/L (ref 38–126)
Anion gap: 11 (ref 5–15)
BILIRUBIN TOTAL: 0.5 mg/dL (ref 0.3–1.2)
BUN: 19 mg/dL (ref 6–20)
CO2: 24 mmol/L (ref 22–32)
Calcium: 9.5 mg/dL (ref 8.9–10.3)
Chloride: 104 mmol/L (ref 101–111)
Creatinine, Ser: 1.13 mg/dL — ABNORMAL HIGH (ref 0.44–1.00)
GFR, EST AFRICAN AMERICAN: 50 mL/min — AB (ref >60)
GFR, EST NON AFRICAN AMERICAN: 43 mL/min — AB (ref >60)
Glucose, Bld: 101 mg/dL — ABNORMAL HIGH (ref 65–99)
POTASSIUM: 3.8 mmol/L (ref 3.5–5.1)
Sodium: 139 mmol/L (ref 135–145)
TOTAL PROTEIN: 7.5 g/dL (ref 6.5–8.1)

## 2016-04-27 LAB — CBC WITH DIFFERENTIAL/PLATELET
Basophils Absolute: 0 10*3/uL (ref 0.0–0.1)
Basophils Relative: 0 %
Eosinophils Absolute: 0.2 10*3/uL (ref 0.0–0.7)
Eosinophils Relative: 2 %
HEMATOCRIT: 39.4 % (ref 36.0–46.0)
Hemoglobin: 12.9 g/dL (ref 12.0–15.0)
LYMPHS ABS: 1.4 10*3/uL (ref 0.7–4.0)
LYMPHS PCT: 17 %
MCH: 28.1 pg (ref 26.0–34.0)
MCHC: 32.7 g/dL (ref 30.0–36.0)
MCV: 85.8 fL (ref 78.0–100.0)
MONO ABS: 0.5 10*3/uL (ref 0.1–1.0)
MONOS PCT: 6 %
NEUTROS ABS: 6.2 10*3/uL (ref 1.7–7.7)
Neutrophils Relative %: 75 %
Platelets: 209 10*3/uL (ref 150–400)
RBC: 4.59 MIL/uL (ref 3.87–5.11)
RDW: 14.5 % (ref 11.5–15.5)
WBC: 8.2 10*3/uL (ref 4.0–10.5)

## 2016-04-27 MED ORDER — SODIUM CHLORIDE 0.9% FLUSH
10.0000 mL | INTRAVENOUS | Status: DC | PRN
  Administered 2016-04-27: 10 mL via INTRAVENOUS
  Filled 2016-04-27: qty 10

## 2016-04-27 MED ORDER — HEPARIN SOD (PORK) LOCK FLUSH 100 UNIT/ML IV SOLN
500.0000 [IU] | Freq: Once | INTRAVENOUS | Status: AC
  Administered 2016-04-27: 500 [IU] via INTRAVENOUS

## 2016-04-27 NOTE — Assessment & Plan Note (Addendum)
Clinical stage IIIC LEFT Ovarian cancer treated surgically on 08/22/2008 followed by 6 cycles of Carboplatin/Paclitaxel?Avastin in accordance with GOG 252- regimen II) finishing in March 2011 followed by Avastin maintence untile recurrent disease was documented on 08/30/2010 at which time she was treated with Carboplatin/Gemcitabine finishing in July 2013 with transition to Tamoxifen 20 mg daily (07/28/2011- 07/09/2013), then Doxil x 6 cycles (07/10/2013- 12/03/2013).  Currently on Letrozole daily since ~ Jan 2017.  Oncology history is up to date.    Labs today: CBC diff, CMET, CA 125.  I personally reviewed and went over laboratory results with the patient.  The results are noted within this dictation.  Labs in 3 months: CBC diff, CMET, CA 125  She will continue with port flushes every 6-8 weeks.  Ongoing weight loss is appreciated.  I will consult RD for consultation.  She will continue with letrozole daily.  I reviewed the risks, benefits, alternatives, and side effects of this intervention including, but not limited to, arthralgias, myalgias, hot flashes, increased risk of secondary malignancy, and increased risk of osteoporosis.  She saw Dr. Rhodia Albright (Gyn Onc) at [facility] on 12/10/2015.  I have reviewed his dictation in the care everywhere.  In summary, his plan is to continue with letrozole and he will see her back in 6 months (approximately June 2018).  She reports an appointment with Dr. Rhodia Albright in the beginning of June 2018.  She is on aromatase inhibitor for maintenance therapy.  On extensive review of her chart, I do not see any documentation of bone density exam.  Given the increased risk for osteoporosis while on aromatase inhibitor, it is certainly reasonable to monitor her bone density.  She reports having one 1-1.5 years ago by Dr. Rowe Pavy.  We will get a copy of this report.  She continues to have issues with falling, having fallen 21-22 times in 2018 thus far secondary to peripheral  neuropathy.  Problem list reviewed with patient and edited accordingly.  Medications are reviewed with the patient and edited accordingly.  Return in 3 months for follow-up.    More than 50% of the time spent with the patient was utilized for counseling and coordination of care.

## 2016-04-27 NOTE — Progress Notes (Signed)
Neale Burly, [place] [address]  Ovarian cancer on left [facility]) - Plan: CBC with Differential, Comprehensive metabolic panel, CA 229  N98 deficiency  Weight loss - Plan: Amb Referral to Nutrition and Diabetic E  Chemotherapy-induced neuropathy ([place]) - Plan: Folate, Vitamin B12  CURRENT THERAPY: Letrozole daily with ongoing gynecologic oncology follow-up with Dr. Rhodia Albright at [facility]  INTERVAL HISTORY: Janice Rogers 83 y.o. female returns for followup of clinical stage IIIC LEFT ovarian cancer treated surgically on 08/22/2008 followed by 6 cycles of carboplatin/paclitaxel/Avastin in accordance with GOG 252-regimen II) finishing in March 2011 followed by Avastin maintenance until recurrence of disease was documented on 08/30/2010.  At that time, she was treated with carboplatin/gemcitabine finishing in July 2013.  Then she was transitioned to tamoxifen 20 mg daily (07/28/2011-07/09/2013).  She then had Doxil 6 cycles (07/10/2013- 12/03/2013).  Currently on letrozole daily beginning ~ Jan 2017.    Ovarian cancer on left [facility])   08/22/2008 Procedure    Exploratory laparotomy, BSO, omentectomy, and bilateral pelvic and para-aortic lymph node dissection (optimally debulked)      10/22/2008 - 03/05/2009 Chemotherapy    GOG 252 Regimen II: Taxol 80 mg/m2 IV days 1, 8, and 15, IP Carboplatin AUC 6 day 1, and Avastin 15 mg/kg day 1 every 21 days x 6 cycles      03/13/2009 - 02/03/2010 Chemotherapy    GOG Regimen II: Maintenance Avastin 15 mg/kg x 16 additional cycles      08/26/2010 Progression    Recurrent disease documented      08/30/2010 - 06/01/2011 Chemotherapy    Gemcitabine 800 mg/m, carboplatin AUC 2 days 1, 8, 15 every 28 days.      07/28/2011 - 07/09/2013 Anti-estrogen oral therapy    Tamoxifen 20 mg daily      07/09/2013 Progression    Progression of disease noted on CT scan      07/10/2013 - 12/03/2013 Chemotherapy    Doxil 50 mg/m every 4 weeks 6  cycles      12/18/2014 Imaging    CT CAP- overall unchanged peritoneal metastatic disease. Multiple other findings are also unchanged from prior CTs. Stable disease.      01/12/2015 -  Anti-estrogen oral therapy    Femara 2.5 mg (Approximate start date)      HPI Elements   Location: LEFT ovary  Quality:   Severity: Stage IIIC  Duration: Dx 08/22/2008  Context: Treated with multiple lines of therapy  Timing:   Modifying Factors:   Associated Signs & Symptoms:    Peripheral neuropathy- chemotherapy-induced. HPI Elements   Location: Lower extrmities  Quality:   Severity: Grade 3  Duration: Years  Context:   Timing:   Modifying Factors:   Associated Signs & Symptoms: Falls    She denies any categorical side effects of aromatase inhibitor therapy including arthralgias, myalgias, hot flashes.  She is provided education regarding the increased risk of osteoporosis while on aromatase inhibitor.  Advised her that I do not see any history of bone density exam is being performed.  I recommended this test.  She reports a number of chronic issues including dizziness, numbness/burning of feet likely secondary to previous chemotherapy, and constipation.  She also reports fatigue.  She reports her appetite is at 75%.  Her energy level is a 25%.  She denies any pain.  Her weight declining compared to 2017.  Review of Systems  Constitutional: Positive for malaise/fatigue.  Negative for chills, fever and weight loss.  HENT: Negative.   Eyes: Negative.   Respiratory: Negative.  Negative for cough.   Cardiovascular: Negative.  Negative for chest pain.  Gastrointestinal: Positive for constipation. Negative for blood in stool, diarrhea, melena, nausea and vomiting.  Genitourinary: Negative.   Musculoskeletal: Positive for falls (chronic, secondary to peripheral neuropathy).  Skin: Negative.   Neurological: Positive for dizziness and sensory change. Negative for weakness.    Endo/Heme/Allergies: Negative.   Psychiatric/Behavioral: Negative.     Past Medical History:  Diagnosis Date  . B12 deficiency 08/05/2015  . Cancer ([place])   . Chemotherapy-induced neuropathy ([place]) 04/27/2016  . Ovarian cancer on left [facility]) 08/05/2015    Past Surgical History:  Procedure Laterality Date  . catarct surgery    . REPLACEMENT TOTAL KNEE      No family history on file.  Social History   Social History  . Marital status: Married    Spouse name: N/A  . Number of children: N/A  . Years of education: N/A   Social History Main Topics  . Smoking status: Former Research scientist (life sciences)  . Smokeless tobacco: Never Used  . Alcohol use No  . Drug use: No  . Sexual activity: No   Other Topics Concern  . Not on file   Social History Narrative  . No narrative on file     PHYSICAL EXAMINATION  ECOG PERFORMANCE STATUS: 2 - Symptomatic, <50% confined to bed  Vitals:   04/27/16 1504  BP: 136/81  Pulse: 87  Resp: 18    GENERAL:alert, no distress, well nourished, well developed, comfortable, cooperative, smiling and in wheelchair, accompanied by husband SKIN: skin color, texture, turgor are normal, no rashes or significant lesions HEAD: Normocephalic, No masses, lesions, tenderness or abnormalities EYES: normal, EOMI, Conjunctiva are pink and non-injected EARS: External ears normal OROPHARYNX:lips, buccal mucosa, and tongue normal and mucous membranes are moist  NECK: supple, trachea midline LYMPH:  not examined BREAST:not examined LUNGS: clear to auscultation  HEART: regular rate & rhythm ABDOMEN:abdomen soft and normal bowel sounds BACK: Back symmetric, no curvature. EXTREMITIES:less then 2 second capillary refill, no joint deformities, effusion, or inflammation, no skin discoloration, no cyanosis  NEURO: alert & oriented x 3 with fluent speech, no focal motor/sensory deficits, in wheelchair   LABORATORY DATA: CBC    Component Value Date/Time   WBC 7.6 03/01/2016 1349    RBC 4.91 03/01/2016 1349   HGB 14.1 03/01/2016 1349   HCT 42.8 03/01/2016 1349   PLT 193 03/01/2016 1349   MCV 87.2 03/01/2016 1349   MCH 28.7 03/01/2016 1349   MCHC 32.9 03/01/2016 1349   RDW 14.4 03/01/2016 1349   LYMPHSABS 1.3 03/01/2016 1349   MONOABS 0.4 03/01/2016 1349   EOSABS 0.2 03/01/2016 1349   BASOSABS 0.0 03/01/2016 1349      Chemistry      Component Value Date/Time   NA 138 03/01/2016 1349   K 3.5 03/01/2016 1349   CL 101 03/01/2016 1349   CO2 28 03/01/2016 1349   BUN 17 03/01/2016 1349   CREATININE 1.07 (H) 03/01/2016 1349      Component Value Date/Time   CALCIUM 9.7 03/01/2016 1349   ALKPHOS 63 03/01/2016 1349   AST 19 03/01/2016 1349   ALT 11 (L) 03/01/2016 1349   BILITOT 0.7 03/01/2016 1349     Lab Results  Component Value Date   CA125 32.6 03/01/2016     PENDING LABS:   RADIOGRAPHIC STUDIES:  No results  found.   PATHOLOGY:    ASSESSMENT AND PLAN:  Ovarian cancer on left [facility]) Clinical stage IIIC LEFT Ovarian cancer treated surgically on 08/22/2008 followed by 6 cycles of Carboplatin/Paclitaxel?Avastin in accordance with GOG 252- regimen II) finishing in March 2011 followed by Avastin maintence untile recurrent disease was documented on 08/30/2010 at which time she was treated with Carboplatin/Gemcitabine finishing in July 2013 with transition to Tamoxifen 20 mg daily (07/28/2011- 07/09/2013), then Doxil x 6 cycles (07/10/2013- 12/03/2013).  Currently on Letrozole daily since ~ Jan 2017.  Oncology history is up to date.    Labs today: CBC diff, CMET, CA 125.  I personally reviewed and went over laboratory results with the patient.  The results are noted within this dictation.  Labs in 3 months: CBC diff, CMET, CA 125  She will continue with port flushes every 6-8 weeks.  Ongoing weight loss is appreciated.  I will consult RD for consultation.  She will continue with letrozole daily.  I reviewed the risks, benefits, alternatives, and side  effects of this intervention including, but not limited to, arthralgias, myalgias, hot flashes, increased risk of secondary malignancy, and increased risk of osteoporosis.  She saw Dr. Rhodia Albright (Gyn Onc) at [facility] on 12/10/2015.  I have reviewed his dictation in the care everywhere.  In summary, his plan is to continue with letrozole and he will see her back in 6 months (approximately June 2018).  She reports an appointment with Dr. Rhodia Albright in the beginning of June 2018.  She is on aromatase inhibitor for maintenance therapy.  On extensive review of her chart, I do not see any documentation of bone density exam.  Given the increased risk for osteoporosis while on aromatase inhibitor, it is certainly reasonable to monitor her bone density.  She reports having one 1-1.5 years ago by Dr. Rowe Pavy.  We will get a copy of this report.  She continues to have issues with falling, having fallen 21-22 times in 2018 thus far secondary to peripheral neuropathy.  Problem list reviewed with patient and edited accordingly.  Medications are reviewed with the patient and edited accordingly.  Return in 3 months for follow-up.    More than 50% of the time spent with the patient was utilized for counseling and coordination of care.  B12 deficiency B12 deficiency on B12 1000 mcg injections monthly being administered at her local pharmacy.  She will continue with this treatment.  Chemotherapy-induced neuropathy ([facility]) Chemotherapy-induced peripheral neuropathy resulting in multiple falls.  Labs in 3 months: B12, folate   ORDERS PLACED FOR THIS ENCOUNTER: Orders Placed This Encounter  Procedures  . CBC with Differential  . Comprehensive metabolic panel  . CA 125  . Folate  . Vitamin B12  . Amb Referral to Nutrition and Diabetic E    MEDICATIONS PRESCRIBED THIS ENCOUNTER: No orders of the defined types were placed in this encounter.   THERAPY PLAN:  Continue with maintenance letrozole daily.  She will follow-up  at [facility] as directed with Dr. Rhodia Albright in June 2018.   All questions were answered. The patient knows to call the clinic with any problems, questions or concerns. We can certainly see the patient much sooner if necessary.  Patient and plan discussed with Dr. Twana First and she is in agreement with the aforementioned.   This note is electronically signed by: Doy Mince 04/27/2016 3:18 PM

## 2016-04-27 NOTE — Assessment & Plan Note (Signed)
Chemotherapy-induced peripheral neuropathy resulting in multiple falls.  Labs in 3 months: B12, folate

## 2016-04-27 NOTE — Patient Instructions (Addendum)
[place] at [facility] Discharge Instructions  RECOMMENDATIONS MADE BY THE CONSULTANT AND ANY TEST RESULTS WILL BE SENT TO YOUR REFERRING PHYSICIAN.  You were seen today by Kirby Crigler PA-C. Continue B12 injection at pharmacy. Port flush every 6 weeks.  Continue Letrozole daily. Follow up at [facility] as directed. Return in 3 months for labs and a follow up.    Thank you for choosing [place] at [facility] to provide your oncology and hematology care.  To afford each patient quality time with our provider, please arrive at least 15 minutes before your scheduled appointment time.    If you have a lab appointment with the [facility] please come in thru the  Main Entrance and check in at the main information desk  You need to re-schedule your appointment should you arrive 10 or more minutes late.  We strive to give you quality time with our providers, and arriving late affects you and other patients whose appointments are after yours.  Also, if you no show three or more times for appointments you may be dismissed from the clinic at the providers discretion.     Again, thank you for choosing [facility].  Our hope is that these requests will decrease the amount of time that you wait before being seen by our physicians.       _____________________________________________________________  Should you have questions after your visit to [facility], please contact our office at [phone] between the hours of 8:30 a.m. and 4:30 p.m.  Voicemails left after 4:30 p.m. will not be returned until the following business day.  For prescription refill requests, have your pharmacy contact our office.       Resources For Cancer Patients and their Caregivers ? American Cancer Society: Can assist with transportation, wigs, general needs, runs Look Good Feel Better.        ([phone] ? Cancer Care: Provides financial  assistance, online support groups, medication/co-pay assistance.  1-800-813-HOPE (434) 460-8229) ? [place] Assists [place] cancer patients and their families through emotional , educational and financial support.  918-474-9190 ? [place] DSS Where to apply for food stamps, Medicaid and utility assistance. (878)477-8254 ? RCATS: Transportation to medical appointments. 845-666-2135 ? Social Security Administration: May apply for disability if have a Stage IV cancer. 325-153-8121 6361991781 ? [employer], Disability and Transit Services: Assists with nutrition, care and transit needs. [place] Support Programs: @10RELATIVEDAYS @ > Cancer Support Group  2nd Tuesday of the month 1pm-2pm, Journey Room  > Creative Journey  3rd Tuesday of the month 1130am-1pm, Journey Room  > Look Good Feel Better  1st Wednesday of the month 10am-12 noon, Journey Room (Call [place] to register [phone])

## 2016-04-27 NOTE — Assessment & Plan Note (Signed)
B12 deficiency on B12 1000 mcg injections monthly being administered at her local pharmacy.  She will continue with this treatment.

## 2016-04-27 NOTE — Progress Notes (Signed)
Domenic Polite [place] presented for Portacath access and flush.  Portacath located right chest wall accessed with  H 20 needle.  Good blood return present. Portacath flushed with 7ml NS and 500U/8ml Heparin and needle removed intact.  Procedure tolerated well and without incident.

## 2016-04-28 LAB — CA 125: CA 125: 36.9 U/mL (ref 0.0–38.1)

## 2016-05-06 ENCOUNTER — Encounter (HOSPITAL_COMMUNITY): Payer: Self-pay

## 2016-05-13 ENCOUNTER — Encounter (HOSPITAL_COMMUNITY): Payer: Medicare Other | Attending: Oncology

## 2016-05-13 DIAGNOSIS — C562 Malignant neoplasm of left ovary: Secondary | ICD-10-CM | POA: Insufficient documentation

## 2016-05-13 NOTE — Progress Notes (Signed)
Nutrition Assessment   Reason for Assessment:   Referral for weight loss.  ASSESSMENT:  83 year old female with ovarian cancer has completed chemotherapy in 2010 then recurrent disease with additional chemotherapy 2012, 2013-2015.  Currently patient is on Letrozole since Jan 2017.    Patient seen in clinic with husband at chairside.  Patient in wheelchair due to neuropathy from chemotherapy.  Patient moves around house with walker but has had multiple falls.   Patient and husband both report good appetite.  Patient does not sleep well at night so does not get up until 9:30-10:00am so does not eat until around 11.  Reports has cereal and piece of toast or egg and cheese sandwich, or egg bacon biscuit.  Then eats dinner around 5pm and may have pizza or meat and vegetables from nearby restaurant.  Patient reports she eats all of restaurant meal.  Also for bedtime snack has bowl of ice cream.  Reports that she has tried ensure recently and likes it.    Patient reports some problems with constipation but medications helps.  No other nutrition related symptoms reported.  Nutrition Focused Physical Exam: deferred   Medications: Vit D, Vit B12  Labs: glucose 101, creatinine 1.13  Anthropometrics:   Height: 63 inches Weight: 143 lb Noted 2% weight loss in the last 3 1/2 months, not significant BMI: 25   Estimated Energy Needs  Kcals: 1625-1950 calories/d Protein: 78-98 g/d Fluid: 1.9 L/d  NUTRITION DIAGNOSIS: none at this time  MALNUTRITION DIAGNOSIS: none at this time   INTERVENTION:   Discussed importance of good nutrition and consuming well balanced diet of fruits, vegetables, lean protein and whole grains.  Encouraged good sources of protein at every meal Discussed ways to increase calories and protein to prevent further weight loss.   Discussed nutrition strategies to help with constipation. Fact sheet given. Discussed the difference between oral nutrition supplements and  samples and coupons given.    MONITORING, EVALUATION, GOAL: Patient will consume adequate calories and protein to prevent further weight loss   NEXT VISIT: as needed.  Patient to contact me with questions or concerns.  Contact information provided  Siniya Lichty B. Zenia Resides, [place], [place] Registered Dietitian [phone] (pager)

## 2016-06-21 ENCOUNTER — Encounter (HOSPITAL_COMMUNITY): Payer: Medicare Other | Attending: Oncology

## 2016-06-21 ENCOUNTER — Encounter (HOSPITAL_COMMUNITY): Payer: Self-pay

## 2016-06-21 VITALS — BP 138/62 | HR 70 | Temp 98.4°F | Resp 18

## 2016-06-21 DIAGNOSIS — C562 Malignant neoplasm of left ovary: Secondary | ICD-10-CM | POA: Insufficient documentation

## 2016-06-21 DIAGNOSIS — Z95828 Presence of other vascular implants and grafts: Secondary | ICD-10-CM

## 2016-06-21 DIAGNOSIS — Z452 Encounter for adjustment and management of vascular access device: Secondary | ICD-10-CM | POA: Diagnosis not present

## 2016-06-21 MED ORDER — SODIUM CHLORIDE 0.9% FLUSH
10.0000 mL | INTRAVENOUS | Status: DC | PRN
  Administered 2016-06-21: 10 mL via INTRAVENOUS
  Filled 2016-06-21: qty 10

## 2016-06-21 MED ORDER — HEPARIN SOD (PORK) LOCK FLUSH 100 UNIT/ML IV SOLN
500.0000 [IU] | Freq: Once | INTRAVENOUS | Status: AC
  Administered 2016-06-21: 500 [IU] via INTRAVENOUS

## 2016-06-21 NOTE — Progress Notes (Signed)
Janice Rogers presented for Portacath access and flush. Portacath located right chest wall accessed with  H 20 needle. Good blood return present. Portacath flushed with 64ml NS and 500U/63ml Heparin and needle removed intact. Procedure without incident. Patient tolerated procedure well.  Vitals stable and discharged home from clinic via wheelchair. Follow up as scheduled.

## 2016-06-21 NOTE — Patient Instructions (Signed)
[place] at [facility] Discharge Instructions  RECOMMENDATIONS MADE BY THE CONSULTANT AND ANY TEST RESULTS WILL BE SENT TO YOUR REFERRING PHYSICIAN.  Port flush Follow up as scheduled.  Thank you for choosing [place] at [facility] to provide your oncology and hematology care.  To afford each patient quality time with our provider, please arrive at least 15 minutes before your scheduled appointment time.    If you have a lab appointment with the [facility] please come in thru the  Main Entrance and check in at the main information desk  You need to re-schedule your appointment should you arrive 10 or more minutes late.  We strive to give you quality time with our providers, and arriving late affects you and other patients whose appointments are after yours.  Also, if you no show three or more times for appointments you may be dismissed from the clinic at the providers discretion.     Again, thank you for choosing [facility].  Our hope is that these requests will decrease the amount of time that you wait before being seen by our physicians.       _____________________________________________________________  Should you have questions after your visit to [facility], please contact our office at [phone] between the hours of 8:30 a.m. and 4:30 p.m.  Voicemails left after 4:30 p.m. will not be returned until the following business day.  For prescription refill requests, have your pharmacy contact our office.       Resources For Cancer Patients and their Caregivers ? American Cancer Society: Can assist with transportation, wigs, general needs, runs Look Good Feel Better.        2317300680 ? Cancer Care: Provides financial assistance, online support groups, medication/co-pay assistance.  1-800-813-HOPE 838-375-9421) ? [place] Assists [place] cancer patients and their  families through emotional , educational and financial support.  3601231653 ? [place] DSS Where to apply for food stamps, Medicaid and utility assistance. 609-455-1801 ? [employer]ATS: Transportation to medical appointments. 662-643-5199 ? Social Security Administration: May apply for disability if have a Stage IV cancer. 647-805-6512 940-836-3796 ? [employer], Disability and Transit Services: Assists with nutrition, care and transit needs. [place] Support Programs: @10RELATIVEDAYS @ > Cancer Support Group  2nd Tuesday of the month 1pm-2pm, Journey Room  > Creative Journey  3rd Tuesday of the month 1130am-1pm, Journey Room  > Look Good Feel Better  1st Wednesday of the month 10am-12 noon, Journey Room (Call [place] to register [phone])

## 2016-08-02 ENCOUNTER — Ambulatory Visit (HOSPITAL_COMMUNITY): Payer: Self-pay

## 2016-08-02 ENCOUNTER — Encounter (HOSPITAL_COMMUNITY): Payer: Self-pay

## 2016-08-30 ENCOUNTER — Other Ambulatory Visit (HOSPITAL_COMMUNITY): Payer: Self-pay

## 2016-08-30 DIAGNOSIS — E538 Deficiency of other specified B group vitamins: Secondary | ICD-10-CM

## 2016-08-30 MED ORDER — CYANOCOBALAMIN 1000 MCG/ML IJ SOLN: 1000.0000 ug | INTRAMUSCULAR | 11 refills | Status: DC

## 2016-08-30 NOTE — Telephone Encounter (Signed)
Received refill request from patients pharmacy for Vitamin B-12 injection. Reviewed with provider, chart checked and refilled.

## 2017-09-01 ENCOUNTER — Other Ambulatory Visit (HOSPITAL_COMMUNITY): Payer: Self-pay | Admitting: *Deleted

## 2017-09-01 DIAGNOSIS — E538 Deficiency of other specified B group vitamins: Secondary | ICD-10-CM

## 2017-09-01 MED ORDER — CYANOCOBALAMIN 1000 MCG/ML IJ SOLN: 1000.0000 ug | INTRAMUSCULAR | 11 refills | Status: DC

## 2018-04-12 ENCOUNTER — Other Ambulatory Visit: Payer: Self-pay

## 2018-04-12 ENCOUNTER — Emergency Department (HOSPITAL_COMMUNITY): Payer: Medicare Other

## 2018-04-12 ENCOUNTER — Inpatient Hospital Stay (HOSPITAL_COMMUNITY)
Admission: EM | Admit: 2018-04-12 | Discharge: 2018-04-17 | DRG: 481 | Disposition: A | Discharge status: Home Health | Payer: Medicare Other | Attending: Orthopedic Surgery | Admitting: Orthopedic Surgery

## 2018-04-12 ENCOUNTER — Encounter (HOSPITAL_COMMUNITY): Payer: Self-pay | Admitting: Emergency Medicine

## 2018-04-12 DIAGNOSIS — D62 Acute posthemorrhagic anemia: Secondary | ICD-10-CM | POA: Diagnosis not present

## 2018-04-12 DIAGNOSIS — Z79890 Hormone replacement therapy (postmenopausal): Secondary | ICD-10-CM

## 2018-04-12 DIAGNOSIS — Z79899 Other long term (current) drug therapy: Secondary | ICD-10-CM | POA: Diagnosis not present

## 2018-04-12 DIAGNOSIS — Z9221 Personal history of antineoplastic chemotherapy: Secondary | ICD-10-CM | POA: Diagnosis not present

## 2018-04-12 DIAGNOSIS — D638 Anemia in other chronic diseases classified elsewhere: Secondary | ICD-10-CM | POA: Diagnosis present

## 2018-04-12 DIAGNOSIS — E538 Deficiency of other specified B group vitamins: Secondary | ICD-10-CM | POA: Diagnosis present

## 2018-04-12 DIAGNOSIS — Z881 Allergy status to other antibiotic agents status: Secondary | ICD-10-CM

## 2018-04-12 DIAGNOSIS — F0390 Unspecified dementia without behavioral disturbance: Secondary | ICD-10-CM | POA: Diagnosis present

## 2018-04-12 DIAGNOSIS — Y92007 Garden or yard of unspecified non-institutional (private) residence as the place of occurrence of the external cause: Secondary | ICD-10-CM | POA: Diagnosis not present

## 2018-04-12 DIAGNOSIS — E039 Hypothyroidism, unspecified: Secondary | ICD-10-CM | POA: Diagnosis present

## 2018-04-12 DIAGNOSIS — M9712XA Periprosthetic fracture around internal prosthetic left knee joint, initial encounter: Secondary | ICD-10-CM | POA: Diagnosis present

## 2018-04-12 DIAGNOSIS — W172XXA Fall into hole, initial encounter: Secondary | ICD-10-CM | POA: Diagnosis present

## 2018-04-12 DIAGNOSIS — Z880 Allergy status to penicillin: Secondary | ICD-10-CM

## 2018-04-12 DIAGNOSIS — S72452A Displaced supracondylar fracture without intracondylar extension of lower end of left femur, initial encounter for closed fracture: Secondary | ICD-10-CM | POA: Diagnosis present

## 2018-04-12 DIAGNOSIS — I10 Essential (primary) hypertension: Secondary | ICD-10-CM | POA: Diagnosis present

## 2018-04-12 DIAGNOSIS — Z888 Allergy status to other drugs, medicaments and biological substances status: Secondary | ICD-10-CM

## 2018-04-12 DIAGNOSIS — Z419 Encounter for procedure for purposes other than remedying health state, unspecified: Secondary | ICD-10-CM

## 2018-04-12 DIAGNOSIS — I700 Atherosclerosis of aorta: Secondary | ICD-10-CM | POA: Diagnosis present

## 2018-04-12 DIAGNOSIS — Z87891 Personal history of nicotine dependence: Secondary | ICD-10-CM | POA: Diagnosis not present

## 2018-04-12 DIAGNOSIS — M25562 Pain in left knee: Secondary | ICD-10-CM | POA: Diagnosis not present

## 2018-04-12 DIAGNOSIS — Z8543 Personal history of malignant neoplasm of ovary: Secondary | ICD-10-CM

## 2018-04-12 DIAGNOSIS — W19XXXA Unspecified fall, initial encounter: Secondary | ICD-10-CM

## 2018-04-12 DIAGNOSIS — R4182 Altered mental status, unspecified: Secondary | ICD-10-CM | POA: Diagnosis present

## 2018-04-12 DIAGNOSIS — R52 Pain, unspecified: Secondary | ICD-10-CM

## 2018-04-12 DIAGNOSIS — S7292XA Unspecified fracture of left femur, initial encounter for closed fracture: Secondary | ICD-10-CM

## 2018-04-12 HISTORY — DX: Anemia in other chronic diseases classified elsewhere: D63.8

## 2018-04-12 HISTORY — DX: Altered mental status, unspecified: R41.82

## 2018-04-12 HISTORY — DX: Acute posthemorrhagic anemia: D62

## 2018-04-12 LAB — CBC
HCT: 32 % — ABNORMAL LOW (ref 36.0–46.0)
Hemoglobin: 9.9 g/dL — ABNORMAL LOW (ref 12.0–15.0)
MCH: 28.1 pg (ref 26.0–34.0)
MCHC: 30.9 g/dL (ref 30.0–36.0)
MCV: 90.9 fL (ref 80.0–100.0)
Platelets: 154 10*3/uL (ref 150–400)
RBC: 3.52 MIL/uL — ABNORMAL LOW (ref 3.87–5.11)
RDW: 14.5 % (ref 11.5–15.5)
WBC: 8 10*3/uL (ref 4.0–10.5)
nRBC: 0 % (ref 0.0–0.2)

## 2018-04-12 LAB — COMPREHENSIVE METABOLIC PANEL
ALT: 53 U/L — ABNORMAL HIGH (ref 0–44)
AST: 54 U/L — ABNORMAL HIGH (ref 15–41)
Albumin: 3.4 g/dL — ABNORMAL LOW (ref 3.5–5.0)
Alkaline Phosphatase: 78 U/L (ref 38–126)
Anion gap: 8 (ref 5–15)
BUN: 23 mg/dL (ref 8–23)
CO2: 25 mmol/L (ref 22–32)
Calcium: 9.2 mg/dL (ref 8.9–10.3)
Chloride: 103 mmol/L (ref 98–111)
Creatinine, Ser: 1.1 mg/dL — ABNORMAL HIGH (ref 0.44–1.00)
GFR calc Af Amer: 52 mL/min — ABNORMAL LOW (ref >60)
GFR calc non Af Amer: 45 mL/min — ABNORMAL LOW (ref >60)
Glucose, Bld: 140 mg/dL — ABNORMAL HIGH (ref 70–99)
Potassium: 3.6 mmol/L (ref 3.5–5.1)
Sodium: 136 mmol/L (ref 135–145)
Total Bilirubin: 0.7 mg/dL (ref 0.3–1.2)
Total Protein: 6 g/dL — ABNORMAL LOW (ref 6.5–8.1)

## 2018-04-12 LAB — SURGICAL PCR SCREEN
MRSA, PCR: NEGATIVE
Staphylococcus aureus: NEGATIVE

## 2018-04-12 MED ORDER — ACETAMINOPHEN 500 MG PO TABS
1000.0000 mg | ORAL_TABLET | Freq: Once | ORAL | Status: AC
  Administered 2018-04-12: 1000 mg via ORAL
  Filled 2018-04-12: qty 2

## 2018-04-12 MED ORDER — POTASSIUM CHLORIDE IN NACL 20-0.9 MEQ/L-% IV SOLN
INTRAVENOUS | Status: DC
  Administered 2018-04-12: 23:00:00 via INTRAVENOUS
  Filled 2018-04-12: qty 1000

## 2018-04-12 MED ORDER — DIPHENHYDRAMINE HCL 12.5 MG/5ML PO ELIX: 12.5000 mg | ORAL_SOLUTION | ORAL | Status: DC | PRN

## 2018-04-12 MED ORDER — DULOXETINE HCL 60 MG PO CPEP
60.0000 mg | ORAL_CAPSULE | Freq: Every day | ORAL | Status: DC
  Administered 2018-04-14 – 2018-04-17 (×4): 60 mg via ORAL
  Filled 2018-04-12 (×4): qty 1

## 2018-04-12 MED ORDER — METHOCARBAMOL 500 MG PO TABS: 500.0000 mg | ORAL_TABLET | Freq: Four times a day (QID) | ORAL | Status: DC | PRN

## 2018-04-12 MED ORDER — ACETAMINOPHEN 325 MG PO TABS: 325.0000 mg | ORAL_TABLET | Freq: Four times a day (QID) | ORAL | Status: DC | PRN

## 2018-04-12 MED ORDER — ONDANSETRON HCL 4 MG/2ML IJ SOLN: 4.0000 mg | Freq: Four times a day (QID) | INTRAMUSCULAR | Status: DC | PRN

## 2018-04-12 MED ORDER — POVIDONE-IODINE 10 % EX SWAB: 2.0000 "application " | Freq: Once | CUTANEOUS | Status: DC

## 2018-04-12 MED ORDER — SENNOSIDES-DOCUSATE SODIUM 8.6-50 MG PO TABS: 1.0000 | ORAL_TABLET | Freq: Every evening | ORAL | Status: DC | PRN

## 2018-04-12 MED ORDER — CEFAZOLIN SODIUM-DEXTROSE 2-4 GM/100ML-% IV SOLN
2.0000 g | INTRAVENOUS | Status: AC
  Administered 2018-04-13: 11:00:00 2 g via INTRAVENOUS
  Filled 2018-04-12: qty 100

## 2018-04-12 MED ORDER — MORPHINE SULFATE (PF) 2 MG/ML IV SOLN
0.5000 mg | INTRAVENOUS | Status: DC | PRN
  Administered 2018-04-13: 1 mg via INTRAVENOUS
  Filled 2018-04-12: qty 1

## 2018-04-12 MED ORDER — ATORVASTATIN CALCIUM 10 MG PO TABS
20.0000 mg | ORAL_TABLET | Freq: Every day | ORAL | Status: DC
  Administered 2018-04-12 – 2018-04-16 (×5): 20 mg via ORAL
  Filled 2018-04-12 (×5): qty 2

## 2018-04-12 MED ORDER — SENNA 8.6 MG PO TABS
1.0000 | ORAL_TABLET | Freq: Two times a day (BID) | ORAL | Status: DC
  Administered 2018-04-12: 8.6 mg via ORAL
  Filled 2018-04-12: qty 1

## 2018-04-12 MED ORDER — BISACODYL 10 MG RE SUPP: 10.0000 mg | Freq: Every day | RECTAL | Status: DC | PRN

## 2018-04-12 MED ORDER — DOCUSATE SODIUM 100 MG PO CAPS
100.0000 mg | ORAL_CAPSULE | Freq: Two times a day (BID) | ORAL | Status: DC
  Administered 2018-04-12: 100 mg via ORAL
  Filled 2018-04-12: qty 1

## 2018-04-12 MED ORDER — HYDROCODONE-ACETAMINOPHEN 7.5-325 MG PO TABS: 1.0000 | ORAL_TABLET | ORAL | Status: DC | PRN

## 2018-04-12 MED ORDER — CHLORHEXIDINE GLUCONATE 4 % EX LIQD
60.0000 mL | Freq: Once | CUTANEOUS | Status: AC
  Administered 2018-04-12: 4 via TOPICAL
  Filled 2018-04-12: qty 45

## 2018-04-12 MED ORDER — AMLODIPINE BESYLATE 5 MG PO TABS
5.0000 mg | ORAL_TABLET | Freq: Every day | ORAL | Status: DC
  Administered 2018-04-14 – 2018-04-17 (×4): 5 mg via ORAL
  Filled 2018-04-12 (×4): qty 1

## 2018-04-12 MED ORDER — ONDANSETRON HCL 4 MG PO TABS: 4.0000 mg | ORAL_TABLET | Freq: Four times a day (QID) | ORAL | Status: DC | PRN

## 2018-04-12 MED ORDER — ACETAMINOPHEN 500 MG PO TABS: 500.0000 mg | ORAL_TABLET | Freq: Four times a day (QID) | ORAL | Status: DC

## 2018-04-12 MED ORDER — ENOXAPARIN SODIUM 40 MG/0.4ML ~~LOC~~ SOLN: 40.0000 mg | SUBCUTANEOUS | Status: DC

## 2018-04-12 MED ORDER — TRANEXAMIC ACID-NACL 1000-0.7 MG/100ML-% IV SOLN
1000.0000 mg | INTRAVENOUS | Status: DC
  Filled 2018-04-12: qty 100

## 2018-04-12 MED ORDER — TRAMADOL HCL 50 MG PO TABS: 50.0000 mg | ORAL_TABLET | Freq: Four times a day (QID) | ORAL | Status: DC

## 2018-04-12 MED ORDER — CHLORHEXIDINE GLUCONATE 4 % EX LIQD
CUTANEOUS | Status: AC
  Administered 2018-04-12: 23:00:00
  Filled 2018-04-12: qty 15

## 2018-04-12 MED ORDER — MIDODRINE HCL 5 MG PO TABS
5.0000 mg | ORAL_TABLET | Freq: Two times a day (BID) | ORAL | Status: DC
  Administered 2018-04-13 – 2018-04-17 (×8): 5 mg via ORAL
  Filled 2018-04-12 (×11): qty 1

## 2018-04-12 MED ORDER — ZOLPIDEM TARTRATE 5 MG PO TABS
5.0000 mg | ORAL_TABLET | Freq: Every evening | ORAL | Status: DC | PRN
  Administered 2018-04-14 – 2018-04-15 (×2): 5 mg via ORAL
  Filled 2018-04-12 (×2): qty 1

## 2018-04-12 MED ORDER — SERTRALINE HCL 100 MG PO TABS
100.0000 mg | ORAL_TABLET | Freq: Every day | ORAL | Status: DC
  Administered 2018-04-14 – 2018-04-17 (×4): 100 mg via ORAL
  Filled 2018-04-12 (×4): qty 1

## 2018-04-12 MED ORDER — FLEET ENEMA 7-19 GM/118ML RE ENEM: 1.0000 | ENEMA | Freq: Once | RECTAL | Status: DC | PRN

## 2018-04-12 MED ORDER — LETROZOLE 2.5 MG PO TABS
2.5000 mg | ORAL_TABLET | Freq: Every day | ORAL | Status: DC
  Administered 2018-04-14 – 2018-04-17 (×4): 2.5 mg via ORAL
  Filled 2018-04-12 (×6): qty 1

## 2018-04-12 MED ORDER — HYDROCODONE-ACETAMINOPHEN 5-325 MG PO TABS: 1.0000 | ORAL_TABLET | ORAL | Status: DC | PRN

## 2018-04-12 MED ORDER — GEMFIBROZIL 600 MG PO TABS
600.0000 mg | ORAL_TABLET | Freq: Two times a day (BID) | ORAL | Status: DC
  Administered 2018-04-13 – 2018-04-17 (×8): 600 mg via ORAL
  Filled 2018-04-12 (×11): qty 1

## 2018-04-12 MED ORDER — LEVOTHYROXINE SODIUM 100 MCG PO TABS
200.0000 ug | ORAL_TABLET | Freq: Every day | ORAL | Status: DC
  Administered 2018-04-14 – 2018-04-17 (×4): 200 ug via ORAL
  Filled 2018-04-12 (×4): qty 2

## 2018-04-12 MED ORDER — METHOCARBAMOL 1000 MG/10ML IJ SOLN
500.0000 mg | Freq: Four times a day (QID) | INTRAVENOUS | Status: DC | PRN
  Filled 2018-04-12: qty 5

## 2018-04-12 NOTE — Progress Notes (Signed)
Arrived to patient's and patient declined visit at this time. Spoke to patient's nurse. Notified of patient's request and instructed to place new consult if VAST was still needed after assessment. VU. Fran Lowes, RN VAST

## 2018-04-12 NOTE — ED Notes (Signed)
Patient transported to X-ray 

## 2018-04-12 NOTE — Plan of Care (Signed)

## 2018-04-12 NOTE — ED Notes (Signed)
ED TO INPATIENT HANDOFF REPORT  ED Nurse Name and Phone #:  Kayse Puccini/Mallory   S Name/Age/Gender Janice Rogers [place] 85 y.o. adult Room/Bed: 013C/013C  Code Status   Code Status: Not on file  Home/SNF/Other Home Patient oriented to: self, place, time and situation Is this baseline? Yes   Triage Complete: Triage complete  Chief Complaint fall/knee injury  Triage Note Pt fell last night and hit her left knee. Pt went to MD today due to significant swelling in left knee. Pt sent here for further evaluation. Pt is on blood thinners. Did not hit head or lose consciousness when she fell.   Allergies Allergies  Allergen Reactions  . Doxycycline Rash and Hives  . Gemcitabine Shortness Of Breath  . Penicillins Rash and Hives    Level of Care/Admitting Diagnosis ED Disposition    ED Disposition Condition Comment   Admit  Hospital Area: [facility] [[id number]]  Level of Care: Med-Surg [16]  Diagnosis: Displaced supracondylar fracture without intracondylar extension of lower end of left femur, initial encounter for closed fracture [facility]) [[id number]]  Admitting Physician: Marchia Bond [[id number]]  Attending Physician: Marchia Bond [[id number]]  Estimated length of stay: past midnight tomorrow  Certification:: I certify this patient will need inpatient services for at least 2 midnights  PT Class (Do Not Modify): Inpatient [101]  PT Acc Code (Do Not Modify): Private [1]       B Medical/Surgery History Past Medical History:  Diagnosis Date  . B12 deficiency 08/05/2015  . Cancer ([place])   . Chemotherapy-induced neuropathy ([place]) 04/27/2016  . Ovarian cancer on left [facility]) 08/05/2015   Past Surgical History:  Procedure Laterality Date  . catarct surgery    . REPLACEMENT TOTAL KNEE       A IV Location/Drains/Wounds Patient Lines/Drains/Airways Status   Active Line/Drains/Airways    Name:   Placement date:   Placement time:   Site:   Days:   Implanted Port Right Chest    -    -    Chest             Intake/Output Last 24 hours No intake or output data in the 24 hours ending 04/12/18 1801  Labs/Imaging No results found for this or any previous visit (from the past 71 hour(s)). Dg Pelvis 1-2 Views  Result Date: 04/12/2018 CLINICAL DATA:  Left lower extremity pain after a fall in her yard last night. Swelling. EXAM: PELVIS - 1-2 VIEW COMPARISON:  Radiograph dated 06/01/2016 FINDINGS: There is no evidence of pelvic fracture or diastasis. No pelvic bone lesions are seen. IMPRESSION: Negative. Electronically Signed   By: Lorriane Shire M.D.   On: 04/12/2018 16:34   Dg Knee Complete 4 Views Left  Result Date: 04/12/2018 CLINICAL DATA:  Stepped in a hole and fell last night. Left knee pain and swelling. EXAM: LEFT KNEE - COMPLETE 4+ VIEW COMPARISON:  04/12/2018 left knee radiographs from [facility] FINDINGS: Sequelae of left total knee arthroplasty are again identified with normal alignment of the prosthetic components. An oblique fracture of the distal femur is unchanged and is again seen to extend from the distal femoral shaft distally to the base of the femoral component of the arthroplasty with a mildly impacted appearance posteriorly. A moderately large knee joint effusion and surrounding soft tissue swelling are again noted. There is no dislocation. IMPRESSION: Unchanged distal femur fracture. Electronically Signed   By: Logan Bores M.D.   On: 04/12/2018 16:49   Dg Femur Min 2  Views Left  Result Date: 04/12/2018 CLINICAL DATA:  Left leg pain since a fall in her yard last night. EXAM: LEFT FEMUR 2 VIEWS COMPARISON:  None. FINDINGS: There is an angulated slightly displaced fracture of the metadiaphyseal region of the distal left femur. Left total knee prosthesis in place. Large hemarthrosis. IMPRESSION: Angulated and slightly displaced comminuted fracture of the metadiaphyseal region of the distal left femur. Electronically Signed   By: Lorriane Shire M.D.   On:  04/12/2018 16:35    Pending Labs [employer] (From admission, onward)    Start     Ordered   Signed and Held  CBC  (enoxaparin (LOVENOX)    CrCl >/= 30 ml/min)  Once,   R    Comments:  Baseline for enoxaparin therapy IF NOT ALREADY DRAWN.  Notify MD if PLT < 100 K.    Signed and Held   Signed and Held  Creatinine, serum  (enoxaparin (LOVENOX)    CrCl >/= 30 ml/min)  Once,   R    Comments:  Baseline for enoxaparin therapy IF NOT ALREADY DRAWN.    Signed and Held   Signed and Held  Creatinine, serum  (enoxaparin (LOVENOX)    CrCl >/= 30 ml/min)  Weekly,   R    Comments:  while on enoxaparin therapy    Signed and Held          Vitals/Pain Today's Vitals   04/12/18 1534 04/12/18 1540 04/12/18 1600 04/12/18 1745  BP:  119/63 (!) 119/51 125/67  Pulse:  100 95   Resp:  17 16 11   Temp:  98.1 F (36.7 C)    TempSrc:  Oral    SpO2:  98% 94%   Weight: 61.2 kg     PainSc:        Isolation Precautions No active isolations  Medications Medications - No data to display  Mobility walks Moderate fall risk   Focused Assessments    R Recommendations: See Admitting Provider Note  Report given to:   Additional Notes:

## 2018-04-12 NOTE — ED Triage Notes (Signed)
Pt fell last night and hit her left knee. Pt went to MD today due to significant swelling in left knee. Pt sent here for further evaluation. Pt is on blood thinners. Did not hit head or lose consciousness when she fell.

## 2018-04-12 NOTE — H&P (Signed)
PREOPERATIVE H&P  Chief Complaint: Left leg pain  HPI: Janice Rogers is a 85 y.o. female who presents after she fell in a hole yesterday.  She had inability to walk.  Came to the emergency room.  Pain localized over the left leg and knee, did have some pre-existing knee pain.  Pain worse with movement, better with rest.  She had a knee replacement done by Dr. Alphonzo Cruise about 20 years ago or so.  She has had some symptoms lately around the knee pre-existing.  Past Medical History:  Diagnosis Date  . B12 deficiency 08/05/2015  . Cancer ([place])   . Chemotherapy-induced neuropathy ([place]) 04/27/2016  . Ovarian cancer on left [facility]) 08/05/2015   Past Surgical History:  Procedure Laterality Date  . catarct surgery    . REPLACEMENT TOTAL KNEE     Social History   Socioeconomic History  . Marital status: Married    Spouse name: Not on file  . Number of children: Not on file  . Years of education: Not on file  . Highest education level: Not on file  Occupational History  . Not on file  Social Needs  . Financial resource strain: Not on file  . Food insecurity:    Worry: Not on file    Inability: Not on file  . Transportation needs:    Medical: Not on file    Non-medical: Not on file  Tobacco Use  . Smoking status: Former Research scientist (life sciences)  . Smokeless tobacco: Never Used  Substance and Sexual Activity  . Alcohol use: No  . Drug use: No  . Sexual activity: Never  Lifestyle  . Physical activity:    Days per week: Not on file    Minutes per session: Not on file  . Stress: Not on file  Relationships  . Social connections:    Talks on phone: Not on file    Gets together: Not on file    Attends religious service: Not on file    Active member of club or organization: Not on file    Attends meetings of clubs or organizations: Not on file    Relationship status: Not on file  Other Topics Concern  . Not on file  Social History Narrative  . Not on file   History reviewed. No pertinent  family history. Allergies  Allergen Reactions  . Doxycycline Rash and Hives  . Gemcitabine Shortness Of Breath  . Penicillins Rash and Hives   Prior to Admission medications   Medication Sig Start Date End Date Taking? Authorizing Provider  amLODipine (NORVASC) 5 MG tablet Take by mouth.    [provider]  atorvastatin (LIPITOR) 20 MG tablet Take by mouth.    [provider]  Cholecalciferol (VITAMIN D) 2000 units CAPS Take by mouth.    [provider]  cyanocobalamin (,VITAMIN B-12,) 1000 MCG/ML injection Inject 1 mL (1,000 mcg total) into the muscle every 30 (thirty) days. 09/01/17   Derek Jack, MD  diphenhydrAMINE (BENADRYL) 25 MG tablet Take by mouth.    [provider]  DULoxetine (CYMBALTA) 60 MG capsule TAKE 1 CAPSULE BY MOUTH EVERY DAY 03/14/16   Baird Cancer, PA-C  gemfibrozil (LOPID) 600 MG tablet Take by mouth.    [provider]  letrozole (FEMARA) 2.5 MG tablet Take by mouth.    [provider]  levothyroxine (SYNTHROID, LEVOTHROID) 100 MCG tablet Take 200 mcg by mouth.     [provider]  lidocaine-prilocaine (EMLA) cream Apply topically.  [provider]  midodrine (PROAMATINE) 5 MG tablet Take 5 mg by mouth.    [provider]  sertraline (ZOLOFT) 100 MG tablet Take by mouth.    [provider]     Positive ROS: All other systems have been reviewed and were otherwise negative with the exception of those mentioned in the HPI and as above.  Physical Exam: General: Alert, no acute distress Cardiovascular: No pedal edema Respiratory: No cyanosis, no use of accessory musculature GI: No organomegaly, abdomen is soft and non-tender Skin: No lesions in the area of chief complaint Neurologic: Sensation intact distally Psychiatric: Patient is competent for consent with normal mood and affect Lymphatic: No axillary or cervical lymphadenopathy  MUSCULOSKELETAL: Left leg  EHL and FHL are intact.  No evidence for open fracture. Physical exam by report from ER physician and Joya Gaskins, orthopedic PA-C.  Assessment: Left periprosthetic femur fracture   Plan: Plan for Procedure(s): INTRAMEDULLARY (IM) RETROGRADE FEMORAL NAILING  The risks benefits and alternatives were discussed with the patient including but not limited to the risks of nonoperative treatment, versus surgical intervention including infection, bleeding, nerve injury, malunion, nonunion, the need for revision surgery, hardware prominence, hardware failure, the need for hardware removal, blood clots, cardiopulmonary complications, morbidity, mortality, among others, and they were willing to proceed.    The patient seemed to be somewhat on the fence about whether to do surgery or not, I offered nonsurgical management, however she is currently unable to ambulate.  After discussion with her husband, she has indicated that she does in fact wish to proceed with surgical intervention.  I will need to order laboratory work-up as well as an EKG, this does not appear to be have been done in the emergency room, in preparation for possible surgery tomorrow morning.     Johnny Bridge, MD Cell [phone]   04/12/2018 9:01 PM

## 2018-04-12 NOTE — ED Provider Notes (Signed)
[facility] EMERGENCY DEPARTMENT Provider Note   CSN: [id number] Arrival date & time: 04/12/18  1518    History   Chief Complaint Chief Complaint  Patient presents with  . Knee Injury    HPI Janice Rogers is a 85 y.o. adult.     The history is provided by the patient and medical records. No language interpreter was used.  Knee Pain  Location:  Knee and leg Time since incident:  1 day Injury: yes   Mechanism of injury: fall   Leg location:  L upper leg Knee location:  L knee Pain details:    Quality:  Sharp   Radiates to:  Does not radiate   Severity:  Severe   Onset quality:  Sudden   Timing:  Constant   Progression:  Unchanged Tetanus status:  Unknown Prior injury to area:  Yes Relieved by:  Nothing Worsened by:  Bearing weight, extension and flexion Associated symptoms: no back pain, no fatigue, no fever and no neck pain     Past Medical History:  Diagnosis Date  . B12 deficiency 08/05/2015  . Cancer ([place])   . Chemotherapy-induced neuropathy ([place]) 04/27/2016  . Ovarian cancer on left [facility]) 08/05/2015    Patient Active Problem List   Diagnosis Date Noted  . Chemotherapy-induced neuropathy ([employer]) 04/27/2016  . Ovarian cancer on left ([place]) 08/05/2015  . B12 deficiency 08/05/2015    Past Surgical History:  Procedure Laterality Date  . catarct surgery    . REPLACEMENT TOTAL KNEE       OB History   No obstetric history on file.      Home Medications    Prior to Admission medications   Medication Sig Start Date End Date Taking? Authorizing Provider  amLODipine (NORVASC) 5 MG tablet Take by mouth.    [provider]  atorvastatin (LIPITOR) 20 MG tablet Take by mouth.    [provider]  Cholecalciferol (VITAMIN D) 2000 units CAPS Take by mouth.    [provider]  cyanocobalamin (,VITAMIN B-12,) 1000 MCG/ML injection Inject 1 mL (1,000 mcg total) into the muscle every 30 (thirty) days. 09/01/17    Derek Jack, MD  diphenhydrAMINE (BENADRYL) 25 MG tablet Take by mouth.    [provider]  DULoxetine (CYMBALTA) 60 MG capsule TAKE 1 CAPSULE BY MOUTH EVERY DAY 03/14/16   Baird Cancer, PA-C  gemfibrozil (LOPID) 600 MG tablet Take by mouth.    [provider]  letrozole (FEMARA) 2.5 MG tablet Take by mouth.    [provider]  levothyroxine (SYNTHROID, LEVOTHROID) 100 MCG tablet Take 200 mcg by mouth.     [provider]  lidocaine-prilocaine (EMLA) cream Apply topically.    [provider]  midodrine (PROAMATINE) 5 MG tablet Take 5 mg by mouth.    [provider]  sertraline (ZOLOFT) 100 MG tablet Take by mouth.    [provider]    Family History History reviewed. No pertinent family history.  Social History Social History   Tobacco Use  . Smoking status: Former Research scientist (life sciences)  . Smokeless tobacco: Never Used  Substance Use Topics  . Alcohol use: No  . Drug use: No     Allergies   Doxycycline; Gemcitabine; and Penicillins   Review of Systems Review of Systems  Constitutional: Negative for chills, diaphoresis, fatigue and fever.  HENT: Negative for congestion.   Eyes: Negative for visual disturbance.  Respiratory: Negative for cough, chest tightness, shortness of breath and  stridor.   Cardiovascular: Negative for chest pain.  Gastrointestinal: Negative for abdominal pain, constipation, diarrhea, nausea and vomiting.  Genitourinary: Negative for decreased urine volume, dysuria and flank pain.  Musculoskeletal: Negative for back pain, neck pain and neck stiffness.  Skin: Negative for rash and wound.  Neurological: Negative for light-headedness and headaches.  Psychiatric/Behavioral: Negative for agitation.  All other systems reviewed and are negative.    Physical Exam Updated Vital Signs BP 119/63 (BP Location: Right Arm)   Pulse 100   Temp 98.1 F (36.7 C) (Oral)   Resp 17   Wt 61.2 kg   SpO2  98%   BMI 23.91 kg/m   Physical Exam Vitals signs and nursing note reviewed.  Constitutional:      General: He is not in acute distress.    Appearance: He is well-developed. He is not ill-appearing, toxic-appearing or diaphoretic.  HENT:     Head: Normocephalic and atraumatic.     Right Ear: External ear normal.     Left Ear: External ear normal.     Nose: Nose normal.     Mouth/Throat:     Pharynx: No oropharyngeal exudate.  Eyes:     Conjunctiva/sclera: Conjunctivae normal.     Pupils: Pupils are equal, round, and reactive to light.  Neck:     Musculoskeletal: Normal range of motion and neck supple. No muscular tenderness.  Cardiovascular:     Rate and Rhythm: Normal rate.     Pulses: Normal pulses.  Pulmonary:     Effort: No respiratory distress.     Breath sounds: No stridor. No wheezing, rhonchi or rales.  Chest:     Chest wall: No tenderness.  Abdominal:     General: There is no distension.     Tenderness: There is no abdominal tenderness. There is no rebound.  Musculoskeletal:        General: Swelling, tenderness, deformity and signs of injury present.     Left knee: He exhibits swelling, deformity and bony tenderness. He exhibits no laceration. Tenderness found.       Legs:     Comments: Tenderness in the left distal femur and knee area.  Also pain with left hip manipulation.  Normal sensation strength and pulses in distal left leg.  No laceration.  Skin:    General: Skin is warm.     Capillary Refill: Capillary refill takes less than 2 seconds.     Findings: No erythema or rash.  Neurological:     General: No focal deficit present.     Mental Status: He is alert and oriented to person, place, and time.     Motor: No abnormal muscle tone.     Coordination: Coordination normal.     Deep Tendon Reflexes: Reflexes are normal and symmetric.  Psychiatric:        Mood and Affect: Mood normal.      ED Treatments / Results  Labs (all labs ordered are listed,  but only abnormal results are displayed) Labs Reviewed - No data to display  EKG None  Radiology Dg Pelvis 1-2 Views  Result Date: 04/12/2018 CLINICAL DATA:  Left lower extremity pain after a fall in her yard last night. Swelling. EXAM: PELVIS - 1-2 VIEW COMPARISON:  Radiograph dated 06/01/2016 FINDINGS: There is no evidence of pelvic fracture or diastasis. No pelvic bone lesions are seen. IMPRESSION: Negative. Electronically Signed   By: Lorriane Shire M.D.   On: 04/12/2018 16:34   Dg Knee Complete 4 Views  Left  Result Date: 04/12/2018 CLINICAL DATA:  Stepped in a hole and fell last night. Left knee pain and swelling. EXAM: LEFT KNEE - COMPLETE 4+ VIEW COMPARISON:  04/12/2018 left knee radiographs from [facility] FINDINGS: Sequelae of left total knee arthroplasty are again identified with normal alignment of the prosthetic components. An oblique fracture of the distal femur is unchanged and is again seen to extend from the distal femoral shaft distally to the base of the femoral component of the arthroplasty with a mildly impacted appearance posteriorly. A moderately large knee joint effusion and surrounding soft tissue swelling are again noted. There is no dislocation. IMPRESSION: Unchanged distal femur fracture. Electronically Signed   By: Logan Bores M.D.   On: 04/12/2018 16:49   Dg Femur Min 2 Views Left  Result Date: 04/12/2018 CLINICAL DATA:  Left leg pain since a fall in her yard last night. EXAM: LEFT FEMUR 2 VIEWS COMPARISON:  None. FINDINGS: There is an angulated slightly displaced fracture of the metadiaphyseal region of the distal left femur. Left total knee prosthesis in place. Large hemarthrosis. IMPRESSION: Angulated and slightly displaced comminuted fracture of the metadiaphyseal region of the distal left femur. Electronically Signed   By: Lorriane Shire M.D.   On: 04/12/2018 16:35    Procedures Procedures (including critical care time)  Medications Ordered in ED  Medications - No data to display   Initial Impression / Assessment and Plan / ED Course  I have reviewed the triage vital signs and the nursing notes.  Pertinent labs & imaging results that were available during my care of the patient were reviewed by me and considered in my medical decision making (see chart for details).        Janice Rogers is a 85 y.o. adult with a past medical history significant for prior ovarian cancer, prior left knee replacement who presents with fall and left knee injury.  Patient reports that she was walking outside on her porch when her left leg went into a hole.  She reports that she felt a crack and has been unable to walk on it since.  She reports the pain is severe and has been swollen.  She reports the pain as moderate at this time.  She denies numbness, tingling, or weakness of her ankle.  She denies any other areas of injury.  She denies other complaints at this time.  She reports her previous orthopedic surgeon has since retired.  She is unsure what group he was with before.  Patient reports she thinks she is on blood thinners and when asked about, and when she thinks they "all sound similar".  Chart did not show anticoagulant use during my initial evaluation.  On exam, patient has shortening of the left leg with tenderness in the left hip, left mid femur, and left knee.  Patient does have palpable pulses and normal sensation and strength of the ankle.  No laceration seen.  Patient's lungs were clear and chest was nontender.  Back and abdomen nontender.  Vital signs reassuring on arrival.  Clinically I have concern for left hip fracture versus left knee fracture.  Patient will have x-rays.  She did not want pain medicine initially.  X-ray shows distal femur fracture just proximal to the hardware.  Orthopedics will be called for recommendations.  5:31 PM Orthopedics called and they will admit patient for further management.       Final Clinical  Impressions(s) / ED Diagnoses   Final diagnoses:  Closed  fracture of left femur, unspecified fracture morphology, unspecified portion of femur, initial encounter ([place])  Fall, initial encounter    ED Discharge Orders    None      Clinical Impression: 1. Closed fracture of left femur, unspecified fracture morphology, unspecified portion of femur, initial encounter ([place])   2. Pain   3. Fall, initial encounter     Disposition: Admit  This note was prepared with assistance of Systems analyst. Occasional wrong-word or sound-a-like substitutions may have occurred due to the inherent limitations of voice recognition software.     Tegeler, Gwenyth Allegra, MD 04/12/18 1816

## 2018-04-13 ENCOUNTER — Encounter (HOSPITAL_COMMUNITY)
Admission: EM | Disposition: A | Discharge status: Home Health | Payer: Self-pay | Source: Home / Self Care | Attending: Orthopedic Surgery

## 2018-04-13 ENCOUNTER — Inpatient Hospital Stay (HOSPITAL_COMMUNITY): Payer: Medicare Other

## 2018-04-13 ENCOUNTER — Encounter (HOSPITAL_COMMUNITY): Payer: Self-pay

## 2018-04-13 ENCOUNTER — Inpatient Hospital Stay (HOSPITAL_COMMUNITY): Payer: Medicare Other | Admitting: Registered Nurse

## 2018-04-13 HISTORY — PX: FEMUR IM NAIL: SHX1597

## 2018-04-13 LAB — CBC
HCT: 28.2 % — ABNORMAL LOW (ref 36.0–46.0)
Hemoglobin: 8.7 g/dL — ABNORMAL LOW (ref 12.0–15.0)
MCH: 29.4 pg (ref 26.0–34.0)
MCHC: 30.9 g/dL (ref 30.0–36.0)
MCV: 95.3 fL (ref 80.0–100.0)
Platelets: 138 10*3/uL — ABNORMAL LOW (ref 150–400)
RBC: 2.96 MIL/uL — ABNORMAL LOW (ref 3.87–5.11)
RDW: 14.7 % (ref 11.5–15.5)
WBC: 11.2 10*3/uL — ABNORMAL HIGH (ref 4.0–10.5)
nRBC: 0 % (ref 0.0–0.2)

## 2018-04-13 LAB — CREATININE, SERUM
Creatinine, Ser: 0.94 mg/dL (ref 0.44–1.00)
GFR calc Af Amer: >60 mL/min (ref >60)
GFR calc non Af Amer: 55 mL/min — ABNORMAL LOW (ref >60)

## 2018-04-13 SURGERY — INSERTION, INTRAMEDULLARY ROD, FEMUR, RETROGRADE
Anesthesia: General | Laterality: Left

## 2018-04-13 MED ORDER — FENTANYL CITRATE (PF) 250 MCG/5ML IJ SOLN
INTRAMUSCULAR | Status: AC
  Filled 2018-04-13: qty 5

## 2018-04-13 MED ORDER — ONDANSETRON HCL 4 MG/2ML IJ SOLN
INTRAMUSCULAR | Status: DC | PRN
  Administered 2018-04-13: 4 mg via INTRAVENOUS

## 2018-04-13 MED ORDER — ACETAMINOPHEN 10 MG/ML IV SOLN
INTRAVENOUS | Status: AC
  Filled 2018-04-13: qty 100

## 2018-04-13 MED ORDER — POTASSIUM CHLORIDE IN NACL 20-0.9 MEQ/L-% IV SOLN
INTRAVENOUS | Status: DC
  Administered 2018-04-13 – 2018-04-17 (×7): via INTRAVENOUS
  Filled 2018-04-13 (×7): qty 1000

## 2018-04-13 MED ORDER — ENOXAPARIN SODIUM 40 MG/0.4ML ~~LOC~~ SOLN
40.0000 mg | SUBCUTANEOUS | Status: DC
  Administered 2018-04-14 – 2018-04-17 (×4): 40 mg via SUBCUTANEOUS
  Filled 2018-04-13 (×4): qty 0.4

## 2018-04-13 MED ORDER — LACTATED RINGERS IV SOLN
INTRAVENOUS | Status: DC | PRN
  Administered 2018-04-13 (×2): via INTRAVENOUS

## 2018-04-13 MED ORDER — PHENOL 1.4 % MT LIQD: 1.0000 | OROMUCOSAL | Status: DC | PRN

## 2018-04-13 MED ORDER — ROCURONIUM BROMIDE 10 MG/ML (PF) SYRINGE
PREFILLED_SYRINGE | INTRAVENOUS | Status: DC | PRN
  Administered 2018-04-13 (×3): 10 mg via INTRAVENOUS
  Administered 2018-04-13: 20 mg via INTRAVENOUS

## 2018-04-13 MED ORDER — DEXAMETHASONE SODIUM PHOSPHATE 10 MG/ML IJ SOLN
INTRAMUSCULAR | Status: DC | PRN
  Administered 2018-04-13: 10 mg via INTRAVENOUS

## 2018-04-13 MED ORDER — ACETAMINOPHEN 500 MG PO TABS
500.0000 mg | ORAL_TABLET | Freq: Four times a day (QID) | ORAL | Status: AC
  Administered 2018-04-13 – 2018-04-14 (×4): 500 mg via ORAL
  Filled 2018-04-13 (×4): qty 1

## 2018-04-13 MED ORDER — PROPOFOL 10 MG/ML IV BOLUS
INTRAVENOUS | Status: DC | PRN
  Administered 2018-04-13: 120 mg via INTRAVENOUS

## 2018-04-13 MED ORDER — PHENYLEPHRINE 40 MCG/ML (10ML) SYRINGE FOR IV PUSH (FOR BLOOD PRESSURE SUPPORT)
PREFILLED_SYRINGE | INTRAVENOUS | Status: AC
  Filled 2018-04-13: qty 10

## 2018-04-13 MED ORDER — ESMOLOL HCL 100 MG/10ML IV SOLN
INTRAVENOUS | Status: DC | PRN
  Administered 2018-04-13 (×2): 20 mg via INTRAVENOUS

## 2018-04-13 MED ORDER — ACETAMINOPHEN 10 MG/ML IV SOLN
INTRAVENOUS | Status: DC | PRN
  Administered 2018-04-13: 1000 mg via INTRAVENOUS

## 2018-04-13 MED ORDER — BISACODYL 10 MG RE SUPP: 10.0000 mg | Freq: Every day | RECTAL | Status: DC | PRN

## 2018-04-13 MED ORDER — HYDROCODONE-ACETAMINOPHEN 7.5-325 MG PO TABS
1.0000 | ORAL_TABLET | ORAL | Status: DC | PRN
  Administered 2018-04-16: 1 via ORAL
  Filled 2018-04-13: qty 1

## 2018-04-13 MED ORDER — LACTATED RINGERS IV BOLUS
250.0000 mL | Freq: Once | INTRAVENOUS | Status: AC
  Administered 2018-04-13: 250 mL via INTRAVENOUS

## 2018-04-13 MED ORDER — ENOXAPARIN SODIUM 40 MG/0.4ML ~~LOC~~ SOLN: 40.0000 mg | SUBCUTANEOUS | 0 refills | Status: DC

## 2018-04-13 MED ORDER — FERROUS SULFATE 325 (65 FE) MG PO TABS
325.0000 mg | ORAL_TABLET | Freq: Three times a day (TID) | ORAL | Status: DC
  Administered 2018-04-13 – 2018-04-17 (×12): 325 mg via ORAL
  Filled 2018-04-13 (×12): qty 1

## 2018-04-13 MED ORDER — SUGAMMADEX SODIUM 200 MG/2ML IV SOLN
INTRAVENOUS | Status: DC | PRN
  Administered 2018-04-13: 200 mg via INTRAVENOUS

## 2018-04-13 MED ORDER — FENTANYL CITRATE (PF) 100 MCG/2ML IJ SOLN
INTRAMUSCULAR | Status: AC
  Filled 2018-04-13: qty 2

## 2018-04-13 MED ORDER — ESMOLOL HCL 100 MG/10ML IV SOLN
INTRAVENOUS | Status: AC
  Filled 2018-04-13: qty 10

## 2018-04-13 MED ORDER — METOCLOPRAMIDE HCL 5 MG PO TABS: 5.0000 mg | ORAL_TABLET | Freq: Three times a day (TID) | ORAL | Status: DC | PRN

## 2018-04-13 MED ORDER — LABETALOL HCL 5 MG/ML IV SOLN
5.0000 mg | INTRAVENOUS | Status: DC | PRN
  Administered 2018-04-13: 5 mg via INTRAVENOUS

## 2018-04-13 MED ORDER — FENTANYL CITRATE (PF) 100 MCG/2ML IJ SOLN
25.0000 ug | INTRAMUSCULAR | Status: DC | PRN
  Administered 2018-04-13 (×4): 25 ug via INTRAVENOUS

## 2018-04-13 MED ORDER — ALUM & MAG HYDROXIDE-SIMETH 200-200-20 MG/5ML PO SUSP: 30.0000 mL | ORAL | Status: DC | PRN

## 2018-04-13 MED ORDER — METOCLOPRAMIDE HCL 5 MG/ML IJ SOLN: 5.0000 mg | Freq: Three times a day (TID) | INTRAMUSCULAR | Status: DC | PRN

## 2018-04-13 MED ORDER — ONDANSETRON HCL 4 MG PO TABS: 4.0000 mg | ORAL_TABLET | Freq: Four times a day (QID) | ORAL | Status: DC | PRN

## 2018-04-13 MED ORDER — PHENYLEPHRINE 40 MCG/ML (10ML) SYRINGE FOR IV PUSH (FOR BLOOD PRESSURE SUPPORT)
PREFILLED_SYRINGE | INTRAVENOUS | Status: DC | PRN
  Administered 2018-04-13 (×3): 80 ug via INTRAVENOUS

## 2018-04-13 MED ORDER — KETOROLAC TROMETHAMINE 15 MG/ML IJ SOLN
7.5000 mg | Freq: Four times a day (QID) | INTRAMUSCULAR | Status: AC
  Administered 2018-04-13 – 2018-04-14 (×4): 7.5 mg via INTRAVENOUS
  Filled 2018-04-13 (×4): qty 1

## 2018-04-13 MED ORDER — LACTATED RINGERS IV SOLN
INTRAVENOUS | Status: DC
  Administered 2018-04-13: 09:00:00 via INTRAVENOUS

## 2018-04-13 MED ORDER — HYDROCODONE-ACETAMINOPHEN 5-325 MG PO TABS: 1.0000 | ORAL_TABLET | Freq: Four times a day (QID) | ORAL | 0 refills | Status: DC | PRN

## 2018-04-13 MED ORDER — SUCCINYLCHOLINE CHLORIDE 200 MG/10ML IV SOSY
PREFILLED_SYRINGE | INTRAVENOUS | Status: DC | PRN
  Administered 2018-04-13: 100 mg via INTRAVENOUS

## 2018-04-13 MED ORDER — MAGNESIUM CITRATE PO SOLN: 1.0000 | Freq: Once | ORAL | Status: DC | PRN

## 2018-04-13 MED ORDER — ACETAMINOPHEN 325 MG PO TABS
325.0000 mg | ORAL_TABLET | Freq: Four times a day (QID) | ORAL | Status: DC | PRN
  Administered 2018-04-16 (×2): 650 mg via ORAL
  Filled 2018-04-13 (×2): qty 2

## 2018-04-13 MED ORDER — LABETALOL HCL 5 MG/ML IV SOLN
INTRAVENOUS | Status: AC
  Filled 2018-04-13: qty 4

## 2018-04-13 MED ORDER — ONDANSETRON HCL 4 MG/2ML IJ SOLN: 4.0000 mg | Freq: Four times a day (QID) | INTRAMUSCULAR | Status: DC | PRN

## 2018-04-13 MED ORDER — HYDROCODONE-ACETAMINOPHEN 5-325 MG PO TABS
1.0000 | ORAL_TABLET | ORAL | Status: DC | PRN
  Administered 2018-04-13: 1 via ORAL
  Filled 2018-04-13: qty 1

## 2018-04-13 MED ORDER — MENTHOL 3 MG MT LOZG: 1.0000 | LOZENGE | OROMUCOSAL | Status: DC | PRN

## 2018-04-13 MED ORDER — LIDOCAINE 2% (20 MG/ML) 5 ML SYRINGE
INTRAMUSCULAR | Status: DC | PRN
  Administered 2018-04-13: 60 mg via INTRAVENOUS

## 2018-04-13 MED ORDER — SODIUM CHLORIDE 0.9 % IV SOLN
INTRAVENOUS | Status: DC | PRN
  Administered 2018-04-13: 11:00:00 15 ug/min via INTRAVENOUS

## 2018-04-13 MED ORDER — PROPOFOL 10 MG/ML IV BOLUS
INTRAVENOUS | Status: AC
  Filled 2018-04-13: qty 20

## 2018-04-13 MED ORDER — DOCUSATE SODIUM 100 MG PO CAPS
100.0000 mg | ORAL_CAPSULE | Freq: Two times a day (BID) | ORAL | Status: DC
  Administered 2018-04-13 – 2018-04-17 (×8): 100 mg via ORAL
  Filled 2018-04-13 (×7): qty 1

## 2018-04-13 MED ORDER — FENTANYL CITRATE (PF) 250 MCG/5ML IJ SOLN
INTRAMUSCULAR | Status: DC | PRN
  Administered 2018-04-13: 50 ug via INTRAVENOUS
  Administered 2018-04-13: 25 ug via INTRAVENOUS
  Administered 2018-04-13: 50 ug via INTRAVENOUS
  Administered 2018-04-13 (×2): 25 ug via INTRAVENOUS
  Administered 2018-04-13: 100 ug via INTRAVENOUS
  Administered 2018-04-13 (×3): 25 ug via INTRAVENOUS

## 2018-04-13 MED ORDER — 0.9 % SODIUM CHLORIDE (POUR BTL) OPTIME
TOPICAL | Status: DC | PRN
  Administered 2018-04-13: 1000 mL

## 2018-04-13 MED ORDER — MORPHINE SULFATE (PF) 2 MG/ML IV SOLN: 0.5000 mg | INTRAVENOUS | Status: DC | PRN

## 2018-04-13 MED ORDER — POLYETHYLENE GLYCOL 3350 17 G PO PACK
17.0000 g | PACK | Freq: Every day | ORAL | Status: DC | PRN
  Administered 2018-04-15 (×2): 17 g via ORAL
  Filled 2018-04-13 (×2): qty 1

## 2018-04-13 MED ORDER — CEFAZOLIN SODIUM-DEXTROSE 2-4 GM/100ML-% IV SOLN
2.0000 g | Freq: Once | INTRAVENOUS | Status: DC
  Filled 2018-04-13: qty 100

## 2018-04-13 MED ORDER — SENNA-DOCUSATE SODIUM 8.6-50 MG PO TABS: 2.0000 | ORAL_TABLET | Freq: Every day | ORAL | 1 refills | Status: AC

## 2018-04-13 SURGICAL SUPPLY — 77 items
APL SKNCLS STERI-STRIP NONHPOA (GAUZE/BANDAGES/DRESSINGS) ×1
BANDAGE ACE 4X5 VEL STRL LF (GAUZE/BANDAGES/DRESSINGS) ×2 IMPLANT
BANDAGE ACE 6X5 VEL STRL LF (GAUZE/BANDAGES/DRESSINGS) ×2 IMPLANT
BANDAGE ESMARK 6X9 LF (GAUZE/BANDAGES/DRESSINGS) IMPLANT
BENZOIN TINCTURE PRP APPL 2/3 (GAUZE/BANDAGES/DRESSINGS) ×1 IMPLANT
BIT DRILL CALIBRATED 4.3MMX365 (DRILL) IMPLANT
BIT DRILL CROWE PNT TWST 4.5MM (DRILL) IMPLANT
BLADE CLIPPER SURG (BLADE) IMPLANT
BLADE SURG 15 STRL LF DISP TIS (BLADE) ×1 IMPLANT
BLADE SURG 15 STRL SS (BLADE) ×2
BNDG CMPR 9X6 STRL LF SNTH (GAUZE/BANDAGES/DRESSINGS)
BNDG CMPR MED 10X6 ELC LF (GAUZE/BANDAGES/DRESSINGS) ×1
BNDG COHESIVE 6X5 TAN STRL LF (GAUZE/BANDAGES/DRESSINGS) ×2 IMPLANT
BNDG ELASTIC 6X10 VLCR STRL LF (GAUZE/BANDAGES/DRESSINGS) ×1 IMPLANT
BNDG ESMARK 6X9 LF (GAUZE/BANDAGES/DRESSINGS)
BNDG GAUZE ELAST 4 BULKY (GAUZE/BANDAGES/DRESSINGS) ×2 IMPLANT
BOOTCOVER CLEANROOM LRG (PROTECTIVE WEAR) ×2 IMPLANT
CANISTER SUCT 3000ML PPV (MISCELLANEOUS) ×2 IMPLANT
COVER SURGICAL LIGHT HANDLE (MISCELLANEOUS) ×2 IMPLANT
COVER WAND RF STERILE (DRAPES) ×1 IMPLANT
CUFF TOURNIQUET SINGLE 34IN LL (TOURNIQUET CUFF) IMPLANT
CUFF TOURNIQUET SINGLE 44IN (TOURNIQUET CUFF) IMPLANT
DRAPE C-ARM 42X72 X-RAY (DRAPES) ×2 IMPLANT
DRAPE C-ARMOR (DRAPES) ×2 IMPLANT
DRAPE HALF SHEET 40X57 (DRAPES) ×5 IMPLANT
DRAPE IMP U-DRAPE 54X76 (DRAPES) ×3 IMPLANT
DRAPE INCISE IOBAN 66X45 STRL (DRAPES) ×2 IMPLANT
DRAPE ORTHO SPLIT 77X108 STRL (DRAPES) ×6
DRAPE SURG ORHT 6 SPLT 77X108 (DRAPES) ×3 IMPLANT
DRAPE U-SHAPE 47X51 STRL (DRAPES) ×2 IMPLANT
DRILL CALIBRATED 4.3MMX365 (DRILL) ×2
DRILL CROWE POINT TWIST 4.5MM (DRILL) ×2
DRSG ADAPTIC 3X8 NADH LF (GAUZE/BANDAGES/DRESSINGS) ×2 IMPLANT
DRSG MEPILEX BORDER 4X4 (GAUZE/BANDAGES/DRESSINGS) ×1 IMPLANT
DURAPREP 26ML APPLICATOR (WOUND CARE) ×1 IMPLANT
ELECT REM PT RETURN 9FT ADLT (ELECTROSURGICAL) ×2
ELECTRODE REM PT RTRN 9FT ADLT (ELECTROSURGICAL) ×1 IMPLANT
FACESHIELD WRAPAROUND (MASK) IMPLANT
FACESHIELD WRAPAROUND OR TEAM (MASK) IMPLANT
GAUZE SPONGE 4X4 12PLY STRL (GAUZE/BANDAGES/DRESSINGS) ×3 IMPLANT
GLOVE BIOGEL PI ORTHO PRO SZ8 (GLOVE) ×2
GLOVE ORTHO TXT STRL SZ7.5 (GLOVE) ×2 IMPLANT
GLOVE PI ORTHO PRO STRL SZ8 (GLOVE) ×2 IMPLANT
GLOVE SURG ORTHO 8.0 STRL STRW (GLOVE) ×4 IMPLANT
GOWN STRL REUS W/ TWL LRG LVL3 (GOWN DISPOSABLE) IMPLANT
GOWN STRL REUS W/ TWL XL LVL3 (GOWN DISPOSABLE) ×2 IMPLANT
GOWN STRL REUS W/TWL 2XL LVL3 (GOWN DISPOSABLE) ×2 IMPLANT
GOWN STRL REUS W/TWL LRG LVL3 (GOWN DISPOSABLE)
GOWN STRL REUS W/TWL XL LVL3 (GOWN DISPOSABLE) ×4
GUIDEPIN 3.2X17.5 THRD DISP (PIN) ×1 IMPLANT
GUIDEWIRE BEAD TIP (WIRE) ×1 IMPLANT
IMMOBILIZER KNEE 22 UNIV (SOFTGOODS) ×1 IMPLANT
KIT BASIN OR (CUSTOM PROCEDURE TRAY) ×2 IMPLANT
KIT TURNOVER KIT B (KITS) ×2 IMPLANT
NAIL FEM RETRO 10.5X360 (Nail) ×1 IMPLANT
NS IRRIG 1000ML POUR BTL (IV SOLUTION) ×2 IMPLANT
PACK GENERAL/GYN (CUSTOM PROCEDURE TRAY) ×2 IMPLANT
PAD ARMBOARD 7.5X6 YLW CONV (MISCELLANEOUS) ×4 IMPLANT
SCREW CORT TI DBL LEAD 5X30 (Screw) ×1 IMPLANT
SCREW CORT TI DBL LEAD 5X56 (Screw) ×1 IMPLANT
SCREW CORT TI DBL LEAD 5X60 (Screw) ×1 IMPLANT
SCREW CORT TI DBL LEAD 5X70 (Screw) ×1 IMPLANT
SCREW CORT TI DBL LEAD 5X75 (Screw) ×1 IMPLANT
STAPLER VISISTAT 35W (STAPLE) ×2 IMPLANT
STOCKINETTE IMPERVIOUS LG (DRAPES) ×2 IMPLANT
STRIP CLOSURE SKIN 1/2X4 (GAUZE/BANDAGES/DRESSINGS) ×1 IMPLANT
SUT VIC AB 0 CT1 18XCR BRD 8 (SUTURE) ×1 IMPLANT
SUT VIC AB 0 CT1 27 (SUTURE) ×4
SUT VIC AB 0 CT1 27XBRD ANBCTR (SUTURE) IMPLANT
SUT VIC AB 0 CT1 8-18 (SUTURE)
SUT VIC AB 2-0 CT1 27 (SUTURE)
SUT VIC AB 2-0 CT1 TAPERPNT 27 (SUTURE) ×1 IMPLANT
SUT VIC AB 3-0 SH 8-18 (SUTURE) ×2 IMPLANT
TOWEL OR 17X24 6PK STRL BLUE (TOWEL DISPOSABLE) ×2 IMPLANT
TOWEL OR 17X26 10 PK STRL BLUE (TOWEL DISPOSABLE) ×2 IMPLANT
TRAY FOLEY MTR SLVR 16FR STAT (SET/KITS/TRAYS/PACK) IMPLANT
WATER STERILE IRR 1000ML POUR (IV SOLUTION) ×2 IMPLANT

## 2018-04-13 NOTE — Anesthesia Procedure Notes (Signed)
Procedure Name: Intubation Date/Time: 04/13/2018 10:41 AM Performed by: Jearld Pies, CRNA Pre-anesthesia Checklist: Patient identified, Emergency Drugs available, Suction available and Patient being monitored Patient Re-evaluated:Patient Re-evaluated prior to induction Oxygen Delivery Method: Circle System Utilized Preoxygenation: Pre-oxygenation with 100% oxygen Induction Type: IV induction and Rapid sequence Laryngoscope Size: Mac and 3 Grade View: Grade I Tube type: Oral Tube size: 7.0 mm Number of attempts: 1 Airway Equipment and Method: Stylet and Oral airway Placement Confirmation: ETT inserted through vocal cords under direct vision,  positive ETCO2 and breath sounds checked- equal and bilateral Secured at: 20 cm Tube secured with: Tape Dental Injury: Teeth and Oropharynx as per pre-operative assessment

## 2018-04-13 NOTE — Op Note (Signed)
04/12/2018 - 04/13/2018  12:24 PM  PATIENT:  Janice Rogers    PRE-OPERATIVE DIAGNOSIS:  left femoral periprosthetic supracondylar fracture  POST-OPERATIVE DIAGNOSIS:  Same  PROCEDURE:  INTRAMEDULLARY (IM) RETROGRADE FEMORAL NAILING  SURGEON:  Johnny Bridge, MD  PHYSICIAN ASSISTANT: Joya Gaskins, OPA-C, present and scrubbed throughout the case, critical for completion in a timely fashion, and for retraction, instrumentation, and closure.  ANESTHESIA:   General  PREOPERATIVE INDICATIONS:  Janice Rogers is a  85 y.o. female who tripped in a hole and had a left periprosthetic femur fracture and elected for surgical management after she was unable to walk.  The risks benefits and alternatives were discussed with the patient including but not limited to the risks of nonoperative treatment, versus surgical intervention including infection, bleeding, nerve injury, malunion, nonunion, the need for revision surgery, hardware prominence, hardware failure, the need for hardware removal, blood clots, cardiopulmonary complications, morbidity, mortality, among others, and they were willing to proceed.     ESTIMATED BLOOD LOSS: 100 mL  OPERATIVE IMPLANTS: Retrograde femoral nail size 10.5 x 360 mm with 4 distal interlocking bolts, 1 proximal interlocking bolt  OPERATIVE FINDINGS: Unstable distal femur supracondylar femur fracture  OPERATIVE PROCEDURE: The patient was brought to the operating room and placed in supine position.  General anesthesia was administered.  IV antibiotics were given.  The left lower extremity was prepped and draped in usual sterile fashion.  Timeout performed.  Anterior incision through her previous total knee incision was carried out and a medial parapatellar approach performed.  The intercondylar notch was exposed, a guidewire introduced into the appropriate location confirmed on AP and lateral views.  The distal femur was opened, the fracture was reduced  anatomically under C arm, a guidewire was passed, I reamed to a size 12 for a 10.5 nail.  The nail was placed, had to be malleted slightly to get final impaction, excellent fixation and reduction of the fracture achieved.  I secured the nail distally with 4 interlocking bolts, secured the locking mechanism, and then placed a statically locked proximal interlocking bolt.  The wounds were irrigated copiously, she was awakened, that was after the closure of the retinaculum and subcutaneous tissue.  She had extremely thin skin closed with nylon.  Sterile gauze was applied, she was awakened and returned to the PACU in stable and satisfactory condition.  There were no complications and she tolerated the procedure well.

## 2018-04-13 NOTE — Transfer of Care (Signed)
Immediate Anesthesia Transfer of Care Note  Patient: Janice Rogers  Procedure(s) Performed: INTRAMEDULLARY (IM) RETROGRADE FEMORAL NAILING (Left )  Patient Location: PACU  Anesthesia Type:General  Level of Consciousness: drowsy, patient cooperative and responds to stimulation  Airway & Oxygen Therapy: Patient Spontanous Breathing and Patient connected to face mask oxygen  Post-op Assessment: Report given to RN and Post -op Vital signs reviewed and stable  Post vital signs: Reviewed and stable  Last Vitals:  Vitals Value Taken Time  BP 143/73 04/13/2018  1:08 PM  Temp    Pulse 109 04/13/2018  1:09 PM  Resp 22 04/13/2018  1:09 PM  SpO2 100 % 04/13/2018  1:09 PM  Vitals shown include unvalidated device data.  Last Pain:  Vitals:   04/13/18 0905  TempSrc:   PainSc: 7       Patients Stated Pain Goal: 0 (73/22/02 5427)  Complications: No apparent anesthesia complications

## 2018-04-13 NOTE — Discharge Instructions (Signed)
Diet: As you were doing prior to hospitalization   Shower:  May shower but keep the wounds dry, use an occlusive plastic wrap, NO SOAKING IN TUB.  If the bandage gets wet, change with a clean dry gauze.  If you have a splint on, leave the splint in place and keep the splint dry with a plastic bag.  Dressing:  You may change your dressing 3-5 days after surgery, unless you have a splint.  If you have a splint, then just leave the splint in place and we will change your bandages during your first follow-up appointment.    If you had hand or foot surgery, we will plan to remove your stitches in about 2 weeks in the office.  For all other surgeries, there are sticky tapes (steri-strips) on your wounds and all the stitches are absorbable.  Leave the steri-strips in place when changing your dressings, they will peel off with time, usually 2-3 weeks.  Activity:  Increase activity slowly as tolerated, but follow the weight bearing instructions below.  The rules on driving is that you can not be taking narcotics while you drive, and you must feel in control of the vehicle.    Weight Bearing:   Touch toe weight bearing.    To prevent constipation: you may use a stool softener such as -  Colace (over the counter) 100 mg by mouth twice a day  Drink plenty of fluids (prune juice may be helpful) and high fiber foods Miralax (over the counter) for constipation as needed.    Itching:  If you experience itching with your medications, try taking only a single pain pill, or even half a pain pill at a time.  You may take up to 10 pain pills per day, and you can also use benadryl over the counter for itching or also to help with sleep.   Precautions:  If you experience chest pain or shortness of breath - call 911 immediately for transfer to the hospital emergency department!!  If you develop a fever greater that 101 F, purulent drainage from wound, increased redness or drainage from wound, or calf pain -- Call the  office at [phone]                                                Follow- Up Appointment:  Please call for an appointment to be seen in 2 weeks Bright - [phone]

## 2018-04-13 NOTE — Progress Notes (Signed)
Notified Dr. Mardelle Matte pt's left upper leg and left knee are significantly more swollen than on first assessment. Per Dr. Mardelle Matte okay to remove dressing and check left knee and leg. On assessment knee appears swollen but no significant drainage. No new orders at this time. Will continue to monitor patient.

## 2018-04-13 NOTE — Progress Notes (Signed)
PT Cancellation Note  Patient Details Name: Janice Rogers MRN: [id number] DOB: [birth year]   Cancelled Treatment:    Reason Eval/Treat Not Completed: (P) Patient at procedure or test/unavailable. In OR for IM nail. PT will follow back tomorrow for Evaluation.  [name] B. [name] PT, DPT Acute Rehabilitation Services Pager [phone] Office [phone]    [place] 04/13/2018, 10:37 AM

## 2018-04-13 NOTE — Progress Notes (Signed)
Pt refusing IV site placement of R chest port access at this time. Noted intermittent confusion and forgetfulness. Pt trying to get out of bed to go home. Call out to Dr. Mardelle Matte to advise. Dr. Mardelle Matte with call back, Spoke to pt concerning admitting diagnosis and surgical plan. Also spoke to patients spouse "Juanda Crumble" and gave updates to situation with patient. After speaking to Dr. Mardelle Matte and Spouse, pt agreed to have IV site placed. IVF's infusing LFA #[id number] without difficulty. Bed exit alarm placed and frequent rounds on pt for safety. Hibiclens Bath completed. Lovenox held per Dr. Mardelle Matte, related to surgery scheduled today. Call light and phone in reach and bed exit alarm maintained. Monitoring and rounds per MD orders and unit protocol.

## 2018-04-13 NOTE — Progress Notes (Signed)
The patient has been re-examined, and the chart reviewed, and there have been no interval changes to the documented history and physical.    The risks, benefits, and alternatives have been discussed at length, and the patient is willing to proceed.     The risks benefits and alternatives were discussed with the patient including but not limited to the risks of nonoperative treatment, versus surgical intervention including infection, bleeding, nerve injury, malunion, nonunion, the need for revision surgery, hardware prominence, hardware failure, the need for hardware removal, blood clots, cardiopulmonary complications, morbidity, mortality, among others, and they were willing to proceed.

## 2018-04-13 NOTE — Anesthesia Preprocedure Evaluation (Addendum)
Anesthesia Evaluation  Patient identified by MRN, date of birth, ID band Patient awake    Reviewed: Allergy & Precautions, NPO status , Patient's Chart, lab work & pertinent test results  History of Anesthesia Complications Negative for: history of anesthetic complications  Airway Mallampati: II  TM Distance: >3 FB Neck ROM: Full    Dental  (+) Partial Upper, Dental Advisory Given, Missing   Pulmonary neg pulmonary ROS, former smoker,    breath sounds clear to auscultation       Cardiovascular hypertension, Pt. on medications  Rhythm:Regular Rate:Normal  02/2018 ECHO: Hyperdynamic left ventricular systolic function, ejection fraction > 79%  Diastolic dysfunction - grade II (elevated filling pressures)  Normal right ventricular systolic function  Mitral annular calcification  Aortic sclerosis   Neuro/Psych negative neurological ROS     GI/Hepatic negative GI ROS, Neg liver ROS,   Endo/Other  Hypothyroidism   Renal/GU negative Renal ROS     Musculoskeletal   Abdominal   Peds  Hematology  (+) Blood dyscrasia (Hb 9.9), ,   Anesthesia Other Findings   Reproductive/Obstetrics Ovarian cancer                           Anesthesia Physical Anesthesia Plan  ASA: II  Anesthesia Plan: General   Post-op Pain Management:    Induction: Intravenous  PONV Risk Score and Plan: 3 and Ondansetron, Dexamethasone and Treatment may vary due to age or medical condition  Airway Management Planned: Oral ETT  Additional Equipment:   Intra-op Plan:   Post-operative Plan: Extubation in OR  Informed Consent: I have reviewed the patients History and Physical, chart, labs and discussed the procedure including the risks, benefits and alternatives for the proposed anesthesia with the patient or authorized representative who has indicated his/her understanding and acceptance.     Dental advisory  given  Plan Discussed with: CRNA and Surgeon  Anesthesia Plan Comments:         Anesthesia Quick Evaluation

## 2018-04-13 NOTE — Social Work (Signed)
CSW acknowledging consult for SNF placement. Will follow for therapy recommendations. Pt will need evaluations for authorization.   Westley Hummer, MSW, Noelia Lenart Work [phone]

## 2018-04-13 NOTE — Plan of Care (Signed)
  Problem: Coping: Goal: Level of anxiety will decrease Outcome: Progressing   Problem: Pain Managment: Goal: General experience of comfort will improve Outcome: Progressing   Problem: Safety: Goal: Ability to remain free from injury will improve Outcome: Progressing   

## 2018-04-13 NOTE — Anesthesia Postprocedure Evaluation (Signed)
Anesthesia Post Note  Patient: Kaysa Roulhac Hayashida  Procedure(s) Performed: INTRAMEDULLARY (IM) RETROGRADE FEMORAL NAILING (Left )     Patient location during evaluation: PACU Anesthesia Type: General Level of consciousness: patient cooperative, oriented and sedated Pain management: pain level controlled Vital Signs Assessment: post-procedure vital signs reviewed and stable Respiratory status: spontaneous breathing, nonlabored ventilation and respiratory function stable Cardiovascular status: blood pressure returned to baseline and stable Postop Assessment: no apparent nausea or vomiting Anesthetic complications: no    Last Vitals:  Vitals:   04/13/18 1455 04/13/18 1504  BP: (!) 121/57 133/66  Pulse: 99 97  Resp: 14 15  Temp:  (!) 36.4 C  SpO2: 95% 98%    Last Pain:  Vitals:   04/13/18 1504  TempSrc:   PainSc: Asleep    LLE Motor Response: Purposeful movement;Responds to commands (04/13/18 1504) LLE Sensation: Full sensation (04/13/18 1504)          Zaccai Chavarin,E. Ebelyn Bohnet

## 2018-04-14 ENCOUNTER — Encounter (HOSPITAL_COMMUNITY): Payer: Self-pay | Admitting: Physician Assistant

## 2018-04-14 DIAGNOSIS — R4182 Altered mental status, unspecified: Secondary | ICD-10-CM

## 2018-04-14 DIAGNOSIS — D62 Acute posthemorrhagic anemia: Secondary | ICD-10-CM | POA: Diagnosis not present

## 2018-04-14 DIAGNOSIS — D638 Anemia in other chronic diseases classified elsewhere: Secondary | ICD-10-CM

## 2018-04-14 HISTORY — DX: Acute posthemorrhagic anemia: D62

## 2018-04-14 HISTORY — DX: Altered mental status, unspecified: R41.82

## 2018-04-14 HISTORY — DX: Anemia in other chronic diseases classified elsewhere: D63.8

## 2018-04-14 LAB — BASIC METABOLIC PANEL
Anion gap: 8 (ref 5–15)
BUN: 20 mg/dL (ref 8–23)
CO2: 24 mmol/L (ref 22–32)
Calcium: 8.6 mg/dL — ABNORMAL LOW (ref 8.9–10.3)
Chloride: 106 mmol/L (ref 98–111)
Creatinine, Ser: 1.07 mg/dL — ABNORMAL HIGH (ref 0.44–1.00)
GFR calc Af Amer: 54 mL/min — ABNORMAL LOW (ref >60)
GFR calc non Af Amer: 47 mL/min — ABNORMAL LOW (ref >60)
Glucose, Bld: 170 mg/dL — ABNORMAL HIGH (ref 70–99)
Potassium: 5 mmol/L (ref 3.5–5.1)
Sodium: 138 mmol/L (ref 135–145)

## 2018-04-14 LAB — HEMOGLOBIN AND HEMATOCRIT, BLOOD
HCT: 27.3 % — ABNORMAL LOW (ref 36.0–46.0)
Hemoglobin: 9.1 g/dL — ABNORMAL LOW (ref 12.0–15.0)

## 2018-04-14 LAB — CBC
HCT: 21.8 % — ABNORMAL LOW (ref 36.0–46.0)
Hemoglobin: 6.8 g/dL — CL (ref 12.0–15.0)
MCH: 28.6 pg (ref 26.0–34.0)
MCHC: 31.2 g/dL (ref 30.0–36.0)
MCV: 91.6 fL (ref 80.0–100.0)
Platelets: 144 10*3/uL — ABNORMAL LOW (ref 150–400)
RBC: 2.38 MIL/uL — ABNORMAL LOW (ref 3.87–5.11)
RDW: 14.7 % (ref 11.5–15.5)
WBC: 8.9 10*3/uL (ref 4.0–10.5)
nRBC: 0 % (ref 0.0–0.2)

## 2018-04-14 LAB — PREPARE RBC (CROSSMATCH)

## 2018-04-14 MED ORDER — SODIUM CHLORIDE 0.9% IV SOLUTION
Freq: Once | INTRAVENOUS | Status: AC
  Administered 2018-04-14: 10:00:00 via INTRAVENOUS

## 2018-04-14 MED ORDER — FUROSEMIDE 10 MG/ML IJ SOLN
20.0000 mg | Freq: Once | INTRAMUSCULAR | Status: AC
  Administered 2018-04-14: 20 mg via INTRAVENOUS
  Filled 2018-04-14: qty 2

## 2018-04-14 MED ORDER — LORAZEPAM 2 MG/ML IJ SOLN
0.5000 mg | INTRAMUSCULAR | Status: DC | PRN
  Administered 2018-04-14 – 2018-04-15 (×2): 0.5 mg via INTRAVENOUS
  Filled 2018-04-14 (×2): qty 1

## 2018-04-14 NOTE — Progress Notes (Signed)
Order placed for 1 unit PRBC- pt had to be type and crossed

## 2018-04-14 NOTE — Evaluation (Addendum)
Physical Therapy Evaluation Patient Details Name: Janice Rogers MRN: [id number] DOB: May 30, 1928 Today's Date: 04/14/2018   History of Present Illness  85 y.o. female admitted on 04/12/18 for fall from home with left knee pain.  Pt dx with L distal femur fx and underwent L IM nailing on 04/13/18.  Pt with post op ABLA requiring blood transfusion.  Pt is TDWB L leg post op.  Pt with significant PMH of mental status alteration, chemotherapy induced neuropathy, CA, anemia of chronic disease, L TKA.     Clinical Impression  Pt was able to sit EOB with me today, however, she was unable to stand from EOB (more due to her cognitive status--her ability to process and sequence).  She will need post acute rehab prior to returning home and currently cannot maintain TDWB on her left leg.  I sent Dr. Mardelle Matte a note re: KI on her left knee to clarify when she needs it on and if we can preform ROM to her left knee.  PT will continue to follow acutely for safe mobility progression    Follow Up Recommendations SNF    Equipment Recommendations  Rolling walker with 5" wheels;3in1 (PT);Wheelchair (measurements PT);Wheelchair cushion (measurements PT);Hospital bed    Recommendations for Other Services   NA    Precautions / Restrictions Precautions Precautions: Fall Required Braces or Orthoses: Knee Immobilizer - Left Knee Immobilizer - Left: On at all times(no knee ROM) Restrictions Weight Bearing Restrictions: Yes LLE Weight Bearing: Touchdown weight bearing      Mobility  Bed Mobility Overal bed mobility: Needs Assistance Bed Mobility: Supine to Sit;Sit to Supine     Supine to sit: Mod assist;HOB elevated Sit to supine: Mod assist   General bed mobility comments: Mod assist for transition to sitting and back to supine.  Assist needed at trunk and left leg to get to EOB and both legs to return to supine.   Transfers Overall transfer level: Needs assistance Equipment used: Rolling walker (2  wheeled) Transfers: Sit to/from Stand Sit to Stand: Total assist         General transfer comment: Attempted several times to stand from bed and pt unable to process what to do (could not sequence through it).  She certainly could not maintain TDWB L leg and kept scooting closer and closer to the edge of bed with every physical attempt.    Ambulation/Gait             General Gait Details: unable at this time.           Balance Overall balance assessment: Needs assistance Sitting-balance support: Feet supported;Bilateral upper extremity supported Sitting balance-Leahy Scale: Poor Sitting balance - Comments: right lateral lean in sitting, needs support of therapist and UEs to keep from falling to the right.  Postural control: Right lateral lean Standing balance support: Bilateral upper extremity supported Standing balance-Leahy Scale: Zero Standing balance comment: unable to stand even supported with RW.                             Pertinent Vitals/Pain Pain Assessment: Faces Faces Pain Scale: Hurts little more Pain Location: left leg Pain Descriptors / Indicators: Guarding;Grimacing Pain Intervention(s): Limited activity within patient's tolerance;Monitored during session;Repositioned    Home Living Family/patient expects to be discharged to:: Private residence Living Arrangements: Spouse/significant other Available Help at Discharge: Family             Additional Comments: Pt  unable to provide any accurate hx    Prior Function           Comments: pt unable to report accurately.     Hand Dominance   Dominant Hand: Right    Extremity/Trunk Assessment   Upper Extremity Assessment Upper Extremity Assessment: Overall WFL for tasks assessed    Lower Extremity Assessment Lower Extremity Assessment: RLE deficits/detail RLE Deficits / Details: right leg immobilized in KI, ankle actively moving, and pt unable to lift leg unassisted.       Cervical / Trunk Assessment Cervical / Trunk Assessment: Normal  Communication   Communication: No difficulties  Cognition Arousal/Alertness: Awake/alert Behavior During Therapy: Restless Overall Cognitive Status: Impaired/Different from baseline Area of Impairment: Orientation;Attention;Memory;Following commands;Safety/judgement;Awareness;Problem solving                 Orientation Level: Disoriented to;Place;Time;Situation Current Attention Level: Sustained Memory: Decreased recall of precautions;Decreased short-term memory Following Commands: Follows one step commands inconsistently Safety/Judgement: Decreased awareness of safety;Decreased awareness of deficits Awareness: Intellectual Problem Solving: Difficulty sequencing;Requires verbal cues;Requires tactile cues;Decreased initiation General Comments: Pt is very confused, does not know where she is, is talking to other people in the room who are not there, is completely unaware that she fell, broke her hip and had surgery.               Assessment/Plan    PT Assessment Patient needs continued PT services  PT Problem List Decreased strength;Decreased range of motion;Decreased activity tolerance;Decreased balance;Decreased mobility;Decreased knowledge of use of DME;Decreased knowledge of precautions;Pain       PT Treatment Interventions DME instruction;Gait training;Stair training;Functional mobility training;Therapeutic activities;Therapeutic exercise;Balance training;Cognitive remediation;Wheelchair mobility training;Patient/family education;Manual techniques;Modalities    PT Goals (Current goals can be found in the Care Plan section)  Acute Rehab PT Goals Patient Stated Goal: unable to state PT Goal Formulation: Patient unable to participate in goal setting Time For Goal Achievement: 04/28/18 Potential to Achieve Goals: Good    Frequency Min 3X/week           AM-PAC PT "6 Clicks" Mobility  Outcome  Measure Help needed turning from your back to your side while in a flat bed without using bedrails?: A Lot Help needed moving from lying on your back to sitting on the side of a flat bed without using bedrails?: A Lot Help needed moving to and from a bed to a chair (including a wheelchair)?: A Lot Help needed standing up from a chair using your arms (e.g., wheelchair or bedside chair)?: Total Help needed to walk in hospital room?: Total Help needed climbing 3-5 steps with a railing? : Total 6 Click Score: 9    End of Session Equipment Utilized During Treatment: Left knee immobilizer Activity Tolerance: Patient limited by pain;Other (comment)(limited by cognitive status) Patient left: in bed;with call bell/phone within reach;with bed alarm set Nurse Communication: Mobility status PT Visit Diagnosis: Muscle weakness (generalized) (M62.81);Difficulty in walking, not elsewhere classified (R26.2);Pain Pain - Right/Left: Left Pain - part of body: Leg    Time: 5176-1607 PT Time Calculation (min) (ACUTE ONLY): 24 min   Charges:      Wells Guiles B. Kimberlin Scheel, PT, DPT  Acute Rehabilitation [phone] pager #[phone] office   PT Evaluation $PT Eval Moderate Complexity: 1 Mod PT Treatments $Therapeutic Activity: 8-22 mins        04/14/2018, 3:40 PM
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
## 2018-04-14 NOTE — Progress Notes (Signed)
Notified phys answering service pt HGB- 6.8 from this morning results- awaiting call back

## 2018-04-14 NOTE — Progress Notes (Signed)
PT Cancellation Note  Patient Details Name: Janice Rogers MRN: [id number] DOB: 02-11-1928   Cancelled Treatment:    Reason Eval/Treat Not Completed: Medical issues which prohibited therapy.  Hgb 6.8, RN hanging first bag of blood now.  PT to check back later as time allows.  Thanks,  Barbarann Ehlers. Rehan Holness, PT, DPT  Acute Rehabilitation [phone] pager [phone]) [phone] office     Wells Guiles B Ledonna Dormer 04/14/2018, 10:20 AM

## 2018-04-14 NOTE — Progress Notes (Signed)
Subjective: 1 Day Post-Op Procedure(s) (LRB): INTRAMEDULLARY (IM) RETROGRADE FEMORAL NAILING (Left) Patient reports pain as mild.   Patient is alert but but confused or has dementia.   Objective: Vital signs in last 24 hours: Temp:  [97 F (36.1 C)-98.6 F (37 C)] 97.8 F (36.6 C) (04/11 0451) Pulse Rate:  [90-121] 95 (04/11 0451) Resp:  [13-19] 16 (04/11 0451) BP: (96-155)/(48-82) 125/48 (04/11 0451) SpO2:  [86 %-100 %] 92 % (04/11 0451) Weight:  [61.2 kg] 61.2 kg (04/10 0905)  Intake/Output from previous day: 04/10 0701 - 04/11 0700 In: 3748.2 [P.O.:100; I.V.:2948.2; IV Piggyback:700] Out: 600 [Urine:450; Blood:150] Intake/Output this shift: No intake/output data recorded.  Recent Labs    04/12/18 2230 04/13/18 1616 04/14/18 0215  HGB 9.9* 8.7* 6.8*   Recent Labs    04/13/18 1616 04/14/18 0215  WBC 11.2* 8.9  RBC 2.96* 2.38*  HCT 28.2* 21.8*  PLT 138* 144*   Recent Labs    04/12/18 2230 04/13/18 1616 04/14/18 0215  NA 136  --  138  K 3.6  --  5.0  CL 103  --  106  CO2 25  --  24  BUN 23  --  20  CREATININE 1.10* 0.94 1.07*  GLUCOSE 140*  --  170*  CALCIUM 9.2  --  8.6*   No results for input(s): LABPT, INR in the last 72 hours.  Neurovascular intact Sensation intact distally Intact pulses distally Dorsiflexion/Plantar flexion intact   Assessment/Plan: 1 Day Post-Op Procedure(s) (LRB): INTRAMEDULLARY (IM) RETROGRADE FEMORAL NAILING (Left) Advance diet Up with therapy  Patient has some significant mental status dysfunction.  With the mental processing limitations and physical limitations of Touchdown weight bearing she may need a short term skilled facility placement.  Patient would rather discharge home however I am concerned that she may not be able to physically safely discharge to home.  With this in mind, I am ordering a social work consult to discuss placement with the patient and her husband.    On a medical note, patient's hemoglobin is 6.8  down from 9.9 on admission.  I have ordered 2 units of packed red cells to be given with 20 mg of Lasix IV between units.        Jahzeel Poythress J Krysia Zahradnik 04/14/2018, 8:29 AM

## 2018-04-14 NOTE — Progress Notes (Signed)
Orthopedic Tech Progress Note Patient Details:  Janice Rogers [facility] [birth year] [id number] THERAPY called and said patient has knee immobilizer. Patient ID: Janice Rogers, female   DOB: 1928-06-10, 85 y.o.   MRN: [id number]   [name] 04/14/2018, 3:43 PM

## 2018-04-14 NOTE — Progress Notes (Signed)
Orthopedic Tech Progress Note Patient Details:  Janice Rogers [facility] 01-14-1928 [id number] Applied Over head frame and trapeze for patient Patient ID: Janice Rogers, female   DOB: 1928-06-21, 85 y.o.   MRN: [id number]   [name] 04/14/2018, 7:35 AM

## 2018-04-15 LAB — BASIC METABOLIC PANEL
Anion gap: 9 (ref 5–15)
BUN: 20 mg/dL (ref 8–23)
CO2: 23 mmol/L (ref 22–32)
Calcium: 8.8 mg/dL — ABNORMAL LOW (ref 8.9–10.3)
Chloride: 107 mmol/L (ref 98–111)
Creatinine, Ser: 0.85 mg/dL (ref 0.44–1.00)
GFR calc Af Amer: >60 mL/min (ref >60)
GFR calc non Af Amer: >60 mL/min (ref >60)
Glucose, Bld: 102 mg/dL — ABNORMAL HIGH (ref 70–99)
Potassium: 4.3 mmol/L (ref 3.5–5.1)
Sodium: 139 mmol/L (ref 135–145)

## 2018-04-15 LAB — TYPE AND SCREEN
ABO/RH(D): O NEG
Antibody Screen: NEGATIVE
Unit division: 0
Unit division: 0

## 2018-04-15 LAB — CBC
HCT: 25.6 % — ABNORMAL LOW (ref 36.0–46.0)
HCT: 25.6 % — ABNORMAL LOW (ref 36.0–46.0)
Hemoglobin: 8.2 g/dL — ABNORMAL LOW (ref 12.0–15.0)
Hemoglobin: 8.1 g/dL — ABNORMAL LOW (ref 12.0–15.0)
MCH: 28.7 pg (ref 26.0–34.0)
MCH: 28.4 pg (ref 26.0–34.0)
MCHC: 32 g/dL (ref 30.0–36.0)
MCHC: 31.6 g/dL (ref 30.0–36.0)
MCV: 89.5 fL (ref 80.0–100.0)
MCV: 89.8 fL (ref 80.0–100.0)
Platelets: 176 10*3/uL (ref 150–400)
Platelets: 175 10*3/uL (ref 150–400)
RBC: 2.86 MIL/uL — ABNORMAL LOW (ref 3.87–5.11)
RBC: 2.85 MIL/uL — ABNORMAL LOW (ref 3.87–5.11)
RDW: 15.8 % — ABNORMAL HIGH (ref 11.5–15.5)
RDW: 15.9 % — ABNORMAL HIGH (ref 11.5–15.5)
WBC: 9 10*3/uL (ref 4.0–10.5)
WBC: 9.2 10*3/uL (ref 4.0–10.5)
nRBC: 0 % (ref 0.0–0.2)
nRBC: 0 % (ref 0.0–0.2)

## 2018-04-15 LAB — BPAM RBC
Blood Product Expiration Date: 202004142359
Blood Product Expiration Date: 202004152359
ISSUE DATE / TIME: 202004111010
Unit Type and Rh: 9500
Unit Type and Rh: 9500

## 2018-04-15 NOTE — Progress Notes (Signed)
Had to cleaned up soaked with urine- increasingly agitated while trying to provide care. Administered another dose ativan- while she was calm lab attempted to get lab work done- she immediately stated to become agitated again so I requested they come back later

## 2018-04-16 ENCOUNTER — Encounter (HOSPITAL_COMMUNITY): Payer: Self-pay | Admitting: Orthopedic Surgery

## 2018-04-16 LAB — CBC
HCT: 26 % — ABNORMAL LOW (ref 36.0–46.0)
Hemoglobin: 8.2 g/dL — ABNORMAL LOW (ref 12.0–15.0)
MCH: 28.3 pg (ref 26.0–34.0)
MCHC: 31.5 g/dL (ref 30.0–36.0)
MCV: 89.7 fL (ref 80.0–100.0)
Platelets: 174 10*3/uL (ref 150–400)
RBC: 2.9 MIL/uL — ABNORMAL LOW (ref 3.87–5.11)
RDW: 14.8 % (ref 11.5–15.5)
WBC: 7.7 10*3/uL (ref 4.0–10.5)
nRBC: 0 % (ref 0.0–0.2)

## 2018-04-16 NOTE — TOC Initial Note (Signed)
Transition of Care [facility]) - Initial/Assessment Note    Patient Details  Name: Janice Rogers MRN: [id number] Date of Birth: 07-06-1928  Transition of Care [facility]) CM/SW Contact:    Alexander Mt, [place] Phone Number: 04/16/2018, 11:35 AM  Clinical Narrative:                 CSW spoke with pt husband Charles via telephone. Introduced self, role, reason for call.  Pt spouse amenable to speaking with CSW. He confirmed that pt is from home with him in [place], they have children and family in [place]. Pt had been receiving services through private aides which husband has arranged for 6 days a week. [facility] had been coming around once a week to visit and check vitals.   Pt husband was provided with PT/OT evals and information, let pt husband know that pt requiring assistance to the point that staff thing SNF is appropriate at discharge. Pt husband responded "a rehab would be the worst possible place for someone of her age, not just because of this virus but she wouldn't have any visitors and she needs support and assistance from me." Pt spouse adamant about pt coming home. He would like pt to have PT/OT at home and knows that she may not be able to receive PT/OT and Hospice at the same time- he would be okay with stopping hospice at home if that is the case. He feels confident in his aides coming in but would definitely like home heath services on top of that.   Pt has walkers, wheelchair, bedside commode, 3in1 and other various DME. CSW will consult with RNCM about arranging [place] and logistics of hospice and [facility] prior to d/c. Pt spouse would like a call with choices when able.   Expected Discharge Plan: [place] Barriers to Discharge: Continued Medical Work up   Patient Goals and CMS Choice Patient states their goals for this hospitalization and ongoing recovery are:: for her to come home CMS Medicare.gov Compare Post Acute Care list provided to:: Patient  Represenative (must comment)(spouse) Choice offered to / list presented to : Spouse  Expected Discharge Plan and Services Expected Discharge Plan: [place] In-house Referral: NA Discharge Planning Services: CM Consult Post Acute Care Choice: Home Health, Resumption of Svcs/PTA Provider Living arrangements for the past 2 months: Single Family Home                     HH Arranged: OT, PT, Nurse's Aide, Refused SNF    Prior Living Arrangements/Services Living arrangements for the past 2 months: Single Family Home Lives with:: Spouse Patient language and need for interpreter reviewed:: No Do you feel safe going back to the place where you live?: Yes      Need for Family Participation in Patient Care: Yes (Comment)(decision making; physical assistance) Care giver support system in place?: Yes (comment)(husband; children; home aides) Current home services: DME, Hospice, Sitter Criminal Activity/Legal Involvement Pertinent to Current Situation/Hospitalization: No - Comment as needed  Activities of Daily Living Home Assistive Devices/Equipment: None ADL Screening (condition at time of admission) Patient's cognitive ability adequate to safely complete daily activities?: No Is the patient deaf or have difficulty hearing?: No Does the patient have difficulty seeing, even when wearing glasses/contacts?: No Does the patient have difficulty concentrating, remembering, or making decisions?: Yes Patient able to express need for assistance with ADLs?: Yes Does the patient have difficulty dressing or bathing?: No  Independently performs ADLs?: No Communication: Independent Dressing (OT): Independent Grooming: Independent Feeding: Independent Bathing: Independent Toileting: Independent with device (comment)(incont brief) In/Out Bed: Independent Walks in Home: Independent Does the patient have difficulty walking or climbing stairs?: No Weakness of Legs: Left Weakness of  Arms/Hands: None  Permission Sought/Granted Permission sought to share information with : Case Manager, Family Supports Permission granted to share information with : No(pt with fluctuating mentation)  Share Information with NAME: Peni Rupard  Permission granted to share info w AGENCY: Home Health/[facility]  Permission granted to share info w Relationship: husband  Permission granted to share info w Contact Information: [phone]  Emotional Assessment Appearance:: Other (Comment Required(spoke with husband via telephone) Attitude/Demeanor/Rapport: Unable to Assess Affect (typically observed): Unable to Assess Orientation: : Oriented to Self, Fluctuating Orientation (Suspected and/or reported Sundowners) Alcohol / Substance Use: Not Applicable Psych Involvement: No (comment)  Admission diagnosis:  Pain [R52] Fall, initial encounter [W19.XXXA] Closed fracture of left femur, unspecified fracture morphology, unspecified portion of femur, initial encounter [facility]) [S72.92XA] Patient Active Problem List   Diagnosis Date Noted  . Anemia of chronic disease 04/14/2018  . Acute blood loss anemia 04/14/2018  . Mental status alteration 04/14/2018  . Displaced supracondylar fracture without intracondylar extension of lower end of left femur, initial encounter for closed fracture ([place]) 04/12/2018  . Chemotherapy-induced neuropathy ([place]) 04/27/2016  . Ovarian cancer on left ([place]) 08/05/2015  . B12 deficiency 08/05/2015   PCP:  Neale Burly, MD Pharmacy:   [place], [place] [address] [place] [address] Phone: [phone] Fax: [phone]     Social Determinants of Health (SDOH) Interventions    Readmission Risk Interventions No flowsheet data found.

## 2018-04-16 NOTE — Plan of Care (Signed)
  Problem: Education: Goal: Knowledge of General Education information will improve Description Including pain rating scale, medication(s)/side effects and non-pharmacologic comfort measures Outcome: Progressing   Problem: Clinical Measurements: Goal: Will remain free from infection Outcome: Progressing Goal: Cardiovascular complication will be avoided Outcome: Progressing   Problem: Nutrition: Goal: Adequate nutrition will be maintained Outcome: Progressing   Problem: Coping: Goal: Level of anxiety will decrease Outcome: Progressing   Problem: Elimination: Goal: Will not experience complications related to urinary retention Outcome: Progressing   Problem: Pain Managment: Goal: General experience of comfort will improve Outcome: Progressing   Problem: Safety: Goal: Ability to remain free from injury will improve Outcome: Progressing   Problem: Education: Goal: Knowledge of General Education information will improve Description Including pain rating scale, medication(s)/side effects and non-pharmacologic comfort measures Outcome: Progressing

## 2018-04-16 NOTE — Progress Notes (Addendum)
Patient ID: Janice Rogers, female   DOB: 1928-04-10, 85 y.o.   MRN: [id number]     Subjective:  Patient reports pain as mild to moderate.  Patient in bed and in no acute distress.  Much more alert today  Objective:   VITALS:   Vitals:   04/15/18 0415 04/15/18 1419 04/15/18 2202 04/16/18 0434  BP: (!) 147/61 (!) 147/110 (!) 159/70 (!) 158/65  Pulse: (!) 101 (!) 109 96 90  Resp: 16 20 14 12   Temp: 98.5 F (36.9 C)  98.7 F (37.1 C) 97.6 F (36.4 C)  TempSrc: Oral  Oral   SpO2: 96% 97% 95% 98%  Weight:      Height:        ABD soft Sensation intact distally Dorsiflexion/Plantar flexion intact Incision: dressing C/D/I and no drainage knee immobilizer in place Dressings not covering the wounds   Lab Results  Component Value Date   WBC 7.7 04/16/2018   HGB 8.2 (L) 04/16/2018   HCT 26.0 (L) 04/16/2018   MCV 89.7 04/16/2018   PLT 174 04/16/2018   BMET    Component Value Date/Time   NA 139 04/15/2018 1113   K 4.3 04/15/2018 1113   CL 107 04/15/2018 1113   CO2 23 04/15/2018 1113   GLUCOSE 102 (H) 04/15/2018 1113   BUN 20 04/15/2018 1113   CREATININE 0.85 04/15/2018 1113   CALCIUM 8.8 (L) 04/15/2018 1113   GFRNONAA >60 04/15/2018 1113   GFRAA >60 04/15/2018 1113     Assessment/Plan: 3 Days Post-Op   Active Problems:   B12 deficiency   Displaced supracondylar fracture without intracondylar extension of lower end of left femur, initial encounter for closed fracture (HCC)   Anemia of chronic disease   Acute blood loss anemia   Mental status alteration   Advance diet Up with therapy Plan for discharge tomorrow if passes PT Dressing changed and wounds are good Knee immobilizer is open to allow for air to the wounds and leg but will need to be in place before PT TTWB Dry dressing PRN   Lunette Stands 04/16/2018, 12:27 PM  ABLA postoperative, plan for discharge home tomorrow likely depending on PT.  Her mental status was deteriorating, I have  stopped all narcotics and contributing medications, including ativan.   Marchia Bond, MD Cell [phone]

## 2018-04-16 NOTE — Plan of Care (Signed)
  Problem: Pain Managment: Goal: General experience of comfort will improve Outcome: Progressing   Problem: Safety: Goal: Ability to remain free from injury will improve Outcome: Progressing   

## 2018-04-16 NOTE — TOC Progression Note (Signed)
Transition of Care [facility]) - Progression Note    Patient Details  Name: Janice Rogers MRN: [id number] Date of Birth: 11-30-1928  Transition of Care [facility]) CM/SW [place], [place] Phone Number: 04/16/2018, 1:12 PM  Clinical Narrative:    CSW called and left message with Cassandra at [facility] to update her that pt family would like for pt to get rehab services await return call.  Called home phone again for pt spouse and got Angus Seller (pt caregiver at home). Pt husband consented to her having list of home health agencies. Provided list and ratings verbally. They are interested in [facility] and [facility] and will discuss the offers together. Pt husband does have CSW phone to reach out to this Probation officer as needed.    Expected Discharge Plan: [place] Barriers to Discharge: Continued Medical Work up  Expected Discharge Plan and Services Expected Discharge Plan: [place] In-house Referral: NA Discharge Planning Services: CM Consult Post Acute Care Choice: Home Health, Resumption of Svcs/PTA Provider Living arrangements for the past 2 months: Single Family Home                     HH Arranged: OT, PT, Nurse's Aide, Refused SNF     Social Determinants of Health (SDOH) Interventions    Readmission Risk Interventions No flowsheet data found.

## 2018-04-16 NOTE — Progress Notes (Addendum)
Physical Therapy Treatment Patient Details Name: Janice Rogers MRN: [id number] DOB: 09/10/1928 Today's Date: 04/16/2018    History of Present Illness 85 y.o. female admitted on 04/12/18 for fall from home with left knee pain.  Pt dx with L distal femur fx and underwent L IM nailing on 04/13/18.  Pt with post op ABLA requiring blood transfusion.  Pt is TDWB L leg post op.  Pt with significant PMH of mental status alteration, chemotherapy induced neuropathy, CA, anemia of chronic disease, L TKA.       PT Comments    I continue to be unable to get Janice Rogers to stand up once EOB.  She has difficulty with processing, initiating, and sequencing of tasks.  I tried several different ways with multimodal cues and pt continues to be unable to initiate move to stand.  I am not sure if she is fearful, painful, or unable to comprehend what I am asking of her and she cannot report.  I continue to recommend that SNF level rehab is her safest option (unless husband can have around the clock caregivers with him to help care for her at home).  If she does go home, she may need more equipment and ambulance transport.  PT will continue to follow acutely for safe mobility progression  Follow Up Recommendations  SNF;Supervision/Assistance - 24 hour;Other (comment)(pt's husband refusing SNF, ambulance transport home, [facility])     [facility] bed;Other (comment)(hoyer lift)    Recommendations for Other Services   NA     Precautions / Restrictions Precautions Precautions: Fall Required Braces or Orthoses: Knee Immobilizer - Left Knee Immobilizer - Left: Other (comment);On at all times(no knee ROM) Restrictions LLE Weight Bearing: Touchdown weight bearing    Mobility  Bed Mobility Overal bed mobility: Needs Assistance Bed Mobility: Supine to Sit;Sit to Supine     Supine to sit: Mod assist;HOB elevated Sit to supine: Mod assist;HOB elevated   General bed mobility comments:  Mod assist to support trunk and transition legs into and out of the bed.  Multimodal cues used to help pt understand what I was asking her to do as she was very slow to initiate movement towards the EOB.    Transfers Overall transfer level: Needs assistance Equipment used: Rolling walker (2 wheeled) Transfers: Sit to/from Stand Sit to Stand: Total assist         General transfer comment: Attempted to stand multiple times using various techniques and cueing.  Pt was unable to stand with support of therapist and/or RW.   Ambulation/Gait             General Gait Details: unable at this time.           Balance Overall balance assessment: Needs assistance Sitting-balance support: Feet supported;Bilateral upper extremity supported Sitting balance-Leahy Scale: Poor Sitting balance - Comments: Needed min assist EOB to prevent posterior LOB.  Postural control: Posterior lean   Standing balance-Leahy Scale: Zero Standing balance comment: unable to get to standing.                             Cognition Arousal/Alertness: Awake/alert Behavior During Therapy: Flat affect;Restless Overall Cognitive Status: Impaired/Different from baseline Area of Impairment: Orientation;Attention;Memory;Following commands;Safety/judgement;Awareness;Problem solving                 Orientation Level: Disoriented to;Person;Place;Time;Situation Current Attention Level: Sustained Memory: Decreased recall of precautions;Decreased short-term memory Following Commands: Follows one step  commands inconsistently Safety/Judgement: Decreased awareness of safety;Decreased awareness of deficits Awareness: Intellectual Problem Solving: Decreased initiation;Difficulty sequencing;Requires verbal cues;Requires tactile cues General Comments: Pt is less confused than last PT session, but still cannot sequence through even simple commands at times.  She would not attempt to stand up with me from EOB,  again, not because I don't think she can physically do it, but something is not clicking in her processing of information to complete the task when asked.  Multimodal cues and different techniques used to try to cue her to stand and she was unable.               Pertinent Vitals/Pain Pain Assessment: Faces Faces Pain Scale: Hurts even more Pain Location: left leg Pain Descriptors / Indicators: Guarding;Grimacing Pain Intervention(s): Monitored during session;Limited activity within patient's tolerance;Repositioned           PT Goals (current goals can now be found in the care plan section) Acute Rehab PT Goals Patient Stated Goal: unable to state Progress towards PT goals: Progressing toward goals    Frequency    Min 5X/week      PT Plan Current plan remains appropriate       AM-PAC PT "6 Clicks" Mobility   Outcome Measure  Help needed turning from your back to your side while in a flat bed without using bedrails?: A Lot Help needed moving from lying on your back to sitting on the side of a flat bed without using bedrails?: A Lot Help needed moving to and from a bed to a chair (including a wheelchair)?: Total Help needed standing up from a chair using your arms (e.g., wheelchair or bedside chair)?: Total Help needed to walk in hospital room?: Total Help needed climbing 3-5 steps with a railing? : Total 6 Click Score: 8    End of Session   Activity Tolerance: Other (comment)(limited by cognition) Patient left: in bed;with call bell/phone within reach;with bed alarm set Nurse Communication: Mobility status;Other (comment)(to Engineer, manufacturing) PT Visit Diagnosis: Muscle weakness (generalized) (M62.81);Difficulty in walking, not elsewhere classified (R26.2);Pain Pain - Right/Left: Left Pain - part of body: Leg     Time: 1345-1405 PT Time Calculation (min) (ACUTE ONLY): 20 min  Charges:  $Therapeutic Activity: 8-22 mins                    Janice Rogers, PT, DPT   Acute Rehabilitation [phone] pager #[phone] office   04/16/2018, 4:00 PM

## 2018-04-17 MED ORDER — ENOXAPARIN SODIUM 40 MG/0.4ML ~~LOC~~ SOLN: 40.0000 mg | SUBCUTANEOUS | 0 refills | Status: DC

## 2018-04-17 MED ORDER — ACETAMINOPHEN 325 MG PO TABS: 650.0000 mg | ORAL_TABLET | Freq: Four times a day (QID) | ORAL | 1 refills | Status: AC | PRN

## 2018-04-17 NOTE — TOC Progression Note (Signed)
Transition of Care [facility]) - Progression Note    Patient Details  Name: Janice Rogers MRN: [id number] Date of Birth: [birth year]  Transition of Care [facility]) CM/SW [place], [place] Phone Number: 04/17/2018, 8:59 AM  Clinical Narrative:    Spoke with pt husband Juanda Crumble, he does not have a [facility] choice at this time, he would like to speak with [facility], provided the number and the contact name that he would need to speak with there.   Pt husband aware he needs to make a decision today for [facility] agency in order to get services arranged for pt.    Expected Discharge Plan: [place] Barriers to Discharge: Continued Medical Work up  Expected Discharge Plan and Services Expected Discharge Plan: [place] In-house Referral: NA Discharge Planning Services: CM Consult Post Acute Care Choice: Home Health, Resumption of Svcs/PTA Provider Living arrangements for the past 2 months: Single Family Home                     HH Arranged: OT, PT, Nurse's Aide, Refused SNF     Social Determinants of Health (SDOH) Interventions    Readmission Risk Interventions No flowsheet data found.

## 2018-04-17 NOTE — TOC Transition Note (Signed)
Transition of Care [facility]) - CM/SW Discharge Note   Patient Details  Name: Janice Rogers MRN: [id number] Date of Birth: 30-Jul-1928  Transition of Care [facility]) CM/SW Contact:  Alexander Mt, [place] Phone Number: 04/17/2018, 12:04 PM   Clinical Narrative:    Pt PT services set up with [place] ([facility]).  RN will continue to visit with [facility], and pt will have home aide continuing to follow. Pt husband will be there 24/7 and he requests she come home. Pt husband declined hospital bed and hoyer lift.    Final next level of care: [place] Barriers to Discharge: Barriers Resolved   Patient Goals and CMS Choice Patient states their goals for this hospitalization and ongoing recovery are:: for her to come home CMS Medicare.gov Compare Post Acute Care list provided to:: Patient Represenative (must comment)(spouse) Choice offered to / list presented to : Spouse  Discharge Placement                Patient to be transferred to facility by: [place] Name of family member notified: pt husband Juanda Crumble Patient and family notified of of transfer: 04/17/18  Discharge Plan and Services In-house Referral: NA Discharge Planning Services: CM Consult Post Acute Care Choice: Home Health, Resumption of Svcs/PTA Provider              HH Arranged: OT, PT, Nurse's Aide, Refused SNF [facility] Agency: [facility] [place] [facility])   Social Determinants of Health (SDOH) Interventions     Readmission Risk Interventions No flowsheet data found.

## 2018-04-17 NOTE — Care Management Important Message (Signed)
Important Message  Patient Details  Name: Janice Rogers MRN: [id number] Date of Birth: 1928/03/29 Due to illness patient is not able to sign.  Unsigned copy left.  Medicare Important Message Given:  Yes    Anguel Delapena 04/17/2018, 3:25 PM

## 2018-04-17 NOTE — Discharge Summary (Addendum)
Physician Discharge Summary  Patient ID: Janice Rogers MRN: [id number] DOB/AGE: [birth year] 85 y.o.  Admit date: 04/12/2018 Discharge date: 04/17/2018  Admission Diagnoses:  Left supracondylar periprosthetic femur fracture  Discharge Diagnoses:  Active Problems:   B12 deficiency   Displaced supracondylar fracture without intracondylar extension of lower end of left femur, initial encounter for closed fracture (HCC)   Anemia of chronic disease   Acute blood loss anemia   Mental status alteration likely due to combination of underlying dementia and acute medication side effects from narcotics  Past Medical History:  Diagnosis Date  . Acute blood loss anemia 04/14/2018  . Anemia of chronic disease 04/14/2018  . B12 deficiency 08/05/2015  . Cancer ([place])   . Chemotherapy-induced neuropathy ([place]) 04/27/2016  . Mental status alteration 04/14/2018  . Ovarian cancer on left [facility]) 08/05/2015    Surgeries: Procedure(s): INTRAMEDULLARY (IM) RETROGRADE FEMORAL NAILING on 04/13/2018   Consultants (if any): Treatment Team:  Marchia Bond, MD  Discharged Condition: Improved  Hospital Course: Janice Rogers is an 85 y.o. female who was admitted 04/12/2018 with a diagnosis of left distal femur fracture and went to the operating room on 04/13/2018 and underwent the above named procedures.    She was given perioperative antibiotics:  Anti-infectives (From admission, onward)   Start     Dose/Rate Route Frequency Ordered Stop   04/13/18 1700  ceFAZolin (ANCEF) IVPB 2g/100 mL premix     2 g 200 mL/hr over 30 Minutes Intravenous  Once 04/13/18 1519     04/13/18 0600  ceFAZolin (ANCEF) IVPB 2g/100 mL premix     2 g 200 mL/hr over 30 Minutes Intravenous On call to O.R. 04/12/18 2113 04/13/18 1055    .  She was given sequential compression devices, early ambulation, and lovenox for DVT prophylaxis.  She was transfused postoperatively for ABLA.  She benefited maximally from the hospital  stay and there were no complications.  Plan for TTWB and HHPT.   Recent vital signs:  Vitals:   04/16/18 2158 04/17/18 0639  BP: (!) 143/59 (!) 160/61  Pulse: 74 70  Resp: 20 16  Temp: 98.7 F (37.1 C) 98.2 F (36.8 C)  SpO2: 97% 96%    Recent laboratory studies:  Lab Results  Component Value Date   HGB 8.2 (L) 04/16/2018   HGB 8.2 (L) 04/15/2018   HGB 8.1 (L) 04/15/2018   Lab Results  Component Value Date   WBC 7.7 04/16/2018   PLT 174 04/16/2018   Lab Results  Component Value Date   INR 1.0 03/27/2008   Lab Results  Component Value Date   NA 139 04/15/2018   K 4.3 04/15/2018   CL 107 04/15/2018   CO2 23 04/15/2018   BUN 20 04/15/2018   CREATININE 0.85 04/15/2018   GLUCOSE 102 (H) 04/15/2018    Discharge Medications:   Allergies as of 04/17/2018      Reactions   Doxycycline Rash, Hives   Gemcitabine Shortness Of Breath   Penicillins Hives, Rash   No reaction to ancef noted 4/10 during surgical IM nail (per Mardelle Matte Md)       Medication List    TAKE these medications   acetaminophen 325 MG tablet Commonly known as:  Tylenol Take 2 tablets (650 mg total) by mouth every 6 (six) hours as needed.   amLODipine 5 MG tablet Commonly known as:  NORVASC Take 5 mg by mouth daily.   atorvastatin 20 MG tablet Commonly known as:  LIPITOR Take  20 mg by mouth daily.   cetirizine 10 MG tablet Commonly known as:  ZYRTEC Take 10 mg by mouth daily.   clopidogrel 75 MG tablet Commonly known as:  PLAVIX Take 75 mg by mouth daily.   cyanocobalamin 1000 MCG/ML injection Commonly known as:  (VITAMIN B-12) Inject 1 mL (1,000 mcg total) into the muscle every 30 (thirty) days.   DULoxetine 30 MG capsule Commonly known as:  CYMBALTA Take 30 mg by mouth 2 (two) times daily.   enoxaparin 40 MG/0.4ML injection Commonly known as:  Lovenox Inject 0.4 mLs (40 mg total) into the skin daily.   gemfibrozil 600 MG tablet Commonly known as:  LOPID Take 600 mg by mouth 2  (two) times daily.   letrozole 2.5 MG tablet Commonly known as:  FEMARA Take 2.5 mg by mouth daily.   levothyroxine 175 MCG tablet Commonly known as:  SYNTHROID, LEVOTHROID Take 175 mcg by mouth daily.   losartan 25 MG tablet Commonly known as:  COZAAR Take 25 mg by mouth daily.   sennosides-docusate sodium 8.6-50 MG tablet Commonly known as:  SENOKOT-S Take 2 tablets by mouth daily.            Durable Medical Equipment  (From admission, onward)         Start     Ordered   04/17/18 0934  For home use only DME 3 n 1  Once     04/17/18 0934          Diagnostic Studies: Dg Pelvis 1-2 Views  Result Date: 04/12/2018 CLINICAL DATA:  Left lower extremity pain after a fall in her yard last night. Swelling. EXAM: PELVIS - 1-2 VIEW COMPARISON:  Radiograph dated 06/01/2016 FINDINGS: There is no evidence of pelvic fracture or diastasis. No pelvic bone lesions are seen. IMPRESSION: Negative. Electronically Signed   By: Lorriane Shire M.D.   On: 04/12/2018 16:34   Dg Knee Complete 4 Views Left  Result Date: 04/12/2018 CLINICAL DATA:  Stepped in a hole and fell last night. Left knee pain and swelling. EXAM: LEFT KNEE - COMPLETE 4+ VIEW COMPARISON:  04/12/2018 left knee radiographs from [facility] FINDINGS: Sequelae of left total knee arthroplasty are again identified with normal alignment of the prosthetic components. An oblique fracture of the distal femur is unchanged and is again seen to extend from the distal femoral shaft distally to the base of the femoral component of the arthroplasty with a mildly impacted appearance posteriorly. A moderately large knee joint effusion and surrounding soft tissue swelling are again noted. There is no dislocation. IMPRESSION: Unchanged distal femur fracture. Electronically Signed   By: Logan Bores M.D.   On: 04/12/2018 16:49   Dg C-arm 1-60 Min  Result Date: 04/13/2018 CLINICAL DATA:  Intraoperative imaging for fixation of periprosthetic  fracture of the distal left femur which the patient suffered in a fall 04/11/2018. EXAM: LEFT FEMUR 2 VIEWS; DG C-ARM 61-120 MIN COMPARISON:  Plain films left femur 04/12/2018. FINDINGS: Six fluoroscopic intraoperative spot views of the left femur are provided. Knee arthroplasty is in place. Images demonstrate placement of an intramedullary nail with 1 proximal and 4 distal screws. The fracture localizes a distal femur fracture. Position alignment are anatomic. No acute abnormality. IMPRESSION: Intraoperative imaging for fixation of a periprosthetic distal left femur fracture. Electronically Signed   By: Inge Rise M.D.   On: 04/13/2018 13:32   Dg Femur Min 2 Views Left  Result Date: 04/13/2018 CLINICAL DATA:  Intraoperative imaging for  fixation of periprosthetic fracture of the distal left femur which the patient suffered in a fall 04/11/2018. EXAM: LEFT FEMUR 2 VIEWS; DG C-ARM 61-120 MIN COMPARISON:  Plain films left femur 04/12/2018. FINDINGS: Six fluoroscopic intraoperative spot views of the left femur are provided. Knee arthroplasty is in place. Images demonstrate placement of an intramedullary nail with 1 proximal and 4 distal screws. The fracture localizes a distal femur fracture. Position alignment are anatomic. No acute abnormality. IMPRESSION: Intraoperative imaging for fixation of a periprosthetic distal left femur fracture. Electronically Signed   By: Inge Rise M.D.   On: 04/13/2018 13:32   Dg Femur Min 2 Views Left  Result Date: 04/12/2018 CLINICAL DATA:  Left leg pain since a fall in her yard last night. EXAM: LEFT FEMUR 2 VIEWS COMPARISON:  None. FINDINGS: There is an angulated slightly displaced fracture of the metadiaphyseal region of the distal left femur. Left total knee prosthesis in place. Large hemarthrosis. IMPRESSION: Angulated and slightly displaced comminuted fracture of the metadiaphyseal region of the distal left femur. Electronically Signed   By: Lorriane Shire M.D.    On: 04/12/2018 16:35    Disposition: Discharge disposition: 01-Home or Self Care         Follow-up Information    Marchia Bond, MD. Schedule an appointment as soon as possible for a visit in 2 weeks.   Specialty:  Orthopedic Surgery Contact information: [address] [place] [address] [phone]            Signed: Johnny Bridge 04/17/2018, 9:35 AM

## 2018-04-17 NOTE — TOC Progression Note (Signed)
Transition of Care [facility]) - Progression Note    Patient Details  Name: Janice Rogers MRN: [id number] Date of Birth: 08-23-1928  Transition of Care [facility]) CM/SW [place], [place] Phone Number: 04/17/2018, 10:01 AM  Clinical Narrative:    Pt husband has spoke with [facility] they recommended [place]. Referral called to Linna Hoff with [facility] PT and RN. Pt husband declines a hospital bed and lift at this time but states Hospice says they can provide that as needed.  When [facility] services confirmed then will arrange PTAR home for pt.   Expected Discharge Plan: [place] Barriers to Discharge: Continued Medical Work up  Expected Discharge Plan and Services Expected Discharge Plan: [place] In-house Referral: NA Discharge Planning Services: CM Consult Post Acute Care Choice: Home Health, Resumption of Svcs/PTA Provider Living arrangements for the past 2 months: Single Family Home Expected Discharge Date: 04/17/18                   HH Arranged: OT, PT, Nurse's Aide, Refused SNF     Social Determinants of Health (SDOH) Interventions    Readmission Risk Interventions No flowsheet data found.

## 2018-04-17 NOTE — Progress Notes (Signed)
Discharge instructions reviewed with pt's husband, Zamari Bonsall.  Pt's husband verbalized understanding and questions answered.  Pt discharged in stable condition via PTAR to home in care of husband.  Eliezer Bottom [place]

## 2018-04-17 NOTE — Progress Notes (Addendum)
Patient ID: Janice Rogers, female   DOB: 01-30-1928, 85 y.o.   MRN: [id number]     Subjective:  Patient reports pain as mild.  Patient in bed and in no acute distress.  Denies any CP or SOB Patient unable to mobilize.  Spoke with the husband and he has been in contact with [facility] and would like the patient to go home with home health.  CSW involved and working with the husband.  Objective:   VITALS:   Vitals:   04/16/18 0434 04/16/18 1405 04/16/18 2158 04/17/18 0639  BP: (!) 158/65 (!) 128/49 (!) 143/59 (!) 160/61  Pulse: 90 80 74 70  Resp: 12 16 20 16   Temp: 97.6 F (36.4 C) 98.8 F (37.1 C) 98.7 F (37.1 C) 98.2 F (36.8 C)  TempSrc:  Oral Oral Oral  SpO2: 98% 95% 97% 96%  Weight:      Height:        ABD soft Sensation intact distally Dorsiflexion/Plantar flexion intact Incision: dressing C/D/I and no drainage  Knee immobilizer at all times   Lab Results  Component Value Date   WBC 7.7 04/16/2018   HGB 8.2 (L) 04/16/2018   HCT 26.0 (L) 04/16/2018   MCV 89.7 04/16/2018   PLT 174 04/16/2018   BMET    Component Value Date/Time   NA 139 04/15/2018 1113   K 4.3 04/15/2018 1113   CL 107 04/15/2018 1113   CO2 23 04/15/2018 1113   GLUCOSE 102 (H) 04/15/2018 1113   BUN 20 04/15/2018 1113   CREATININE 0.85 04/15/2018 1113   CALCIUM 8.8 (L) 04/15/2018 1113   GFRNONAA >60 04/15/2018 1113   GFRAA >60 04/15/2018 1113     Assessment/Plan: 4 Days Post-Op   Active Problems:   B12 deficiency   Displaced supracondylar fracture without intracondylar extension of lower end of left femur, initial encounter for closed fracture (HCC)   Anemia of chronic disease   Acute blood loss anemia   Mental status alteration   Advance diet Up with therapy  Plan for DC home if Home health is arranged CSW is involved working with Husband to arrange Home health Patient is slow to mobilize and if unable to arrange [facility] may need SNF placement TTWB left lower ext  Dry dressing PRN   Lunette Stands 04/17/2018, 9:11 AM  Discussed and agree with above.   Marchia Bond, MD Cell [phone]

## 2018-04-17 NOTE — Progress Notes (Signed)
Physical Therapy Treatment Patient Details Name: Janice Rogers MRN: [id number] DOB: 06-03-1928 Today's Date: 04/17/2018    History of Present Illness Pt is 85 y.o. female admitted on 04/12/18 after fall at home with left knee pain. Found to have L distal femur fx; s/p L IM nailing on 4/10. Pt with post op ABLA requiring blood transfusion. PMH includes mental status alteration, chemotherapy induced neuropathy, CA, anemia, L TKA.      PT Comments    Pt slowly progressing with mobility. Able to initiate standing with maxA, but pt unable to maintain LLE TDWB precautions despite multimodal cues; difficulty following commands due to cognitive impairments. Husband declined post-acute rehab at SNF as well as hoyer lift/hospital bed; pt to return home with [facility] services and 24/7 assist.    Follow Up Recommendations  SNF;Supervision/Assistance - 24 hour(husband declined SNF; returning home with [facility] and hospice)     Equipment Recommendations  None recommended by PT(husband declined hospital bed and hoyer lift)    Recommendations for Other Services       Precautions / Restrictions Precautions Precautions: Fall Required Braces or Orthoses: Knee Immobilizer - Left Knee Immobilizer - Left: Other (comment);On at all times(no knee ROM) Restrictions Weight Bearing Restrictions: Yes LLE Weight Bearing: Touchdown weight bearing    Mobility  Bed Mobility Overal bed mobility: Needs Assistance Bed Mobility: Sit to Supine       Sit to supine: Mod assist   General bed mobility comments: ModA to assist BLEs to supine, pt able to assist with BUE/trunk well; putting weight through LLE when respositioning in bed despite cues. Able to move hips well  Transfers Overall transfer level: Needs assistance Equipment used: 1 person hand held assist Transfers: Sit to/from Stand;Stand Pivot Transfers Sit to Stand: Max assist Stand pivot transfers: Max assist       General transfer comment: MaxA to  perform stand pivot from recliner to bed; pt unable to maintain LLE TDWB precautions no matter the cue/assist provided. Reaching for second UE support on IV pole despite cues. Recommend +2 safety with future transfers  Ambulation/Gait             General Gait Details: Unable to maintain precautions   Stairs             Wheelchair Mobility    Modified Rankin (Stroke Patients Only)       Balance Overall balance assessment: Needs assistance Sitting-balance support: Feet supported;Bilateral upper extremity supported Sitting balance-Leahy Scale: Fair   Postural control: Posterior lean Standing balance support: Bilateral upper extremity supported Standing balance-Leahy Scale: Zero Standing balance comment: Reliant on maxA and UE support; unable to maintain LLE precautions                            Cognition Arousal/Alertness: Awake/alert Behavior During Therapy: Flat affect;Restless Overall Cognitive Status: No family/caregiver present to determine baseline cognitive functioning Area of Impairment: Orientation;Attention;Memory;Following commands;Safety/judgement;Awareness;Problem solving                 Orientation Level: Disoriented to;Place;Time;Situation Current Attention Level: Sustained Memory: Decreased recall of precautions;Decreased short-term memory Following Commands: Follows one step commands inconsistently Safety/Judgement: Decreased awareness of safety;Decreased awareness of deficits Awareness: Intellectual Problem Solving: Decreased initiation;Difficulty sequencing;Requires verbal cues;Requires tactile cues General Comments: Seems to be lest restless, but reluctant to set down things in hands despite attempting to get up from chair. Unable to maintain LLE TDWB precautions despite max, multimodal cues and various  techniques to cue this. Able to state she lives with husband of 16 years      Exercises      General Comments         Pertinent Vitals/Pain Pain Assessment: Faces Faces Pain Scale: Hurts a little bit Pain Location: BLEs Pain Descriptors / Indicators: Sore;Discomfort Pain Intervention(s): Monitored during session;Limited activity within patient's tolerance    Home Living                      Prior Function            PT Goals (current goals can now be found in the care plan section) Acute Rehab PT Goals Patient Stated Goal: "Going home today is good news" PT Goal Formulation: With patient Time For Goal Achievement: 04/28/18 Potential to Achieve Goals: Good Progress towards PT goals: Progressing toward goals    Frequency    Min 5X/week      PT Plan Current plan remains appropriate    Co-evaluation              AM-PAC PT "6 Clicks" Mobility   Outcome Measure  Help needed turning from your back to your side while in a flat bed without using bedrails?: A Lot Help needed moving from lying on your back to sitting on the side of a flat bed without using bedrails?: A Lot Help needed moving to and from a bed to a chair (including a wheelchair)?: A Lot Help needed standing up from a chair using your arms (e.g., wheelchair or bedside chair)?: A Lot Help needed to walk in hospital room?: Total Help needed climbing 3-5 steps with a railing? : Total 6 Click Score: 10    End of Session Equipment Utilized During Treatment: Left knee immobilizer;Gait belt Activity Tolerance: Other (comment)(Limited by cognition) Patient left: in bed;with call bell/phone within reach;with bed alarm set Nurse Communication: Mobility status PT Visit Diagnosis: Muscle weakness (generalized) (M62.81);Difficulty in walking, not elsewhere classified (R26.2);Pain Pain - Right/Left: Left Pain - part of body: Leg     Time: 1130-1146 PT Time Calculation (min) (ACUTE ONLY): 16 min  Charges:  $Therapeutic Activity: 8-22 mins                    Mabeline Caras, PT, DPT Acute Rehabilitation Services   Pager [phone] Office [phone]  Derry Lory 04/17/2018, 12:21 PM

## 2018-07-03 ENCOUNTER — Inpatient Hospital Stay (HOSPITAL_COMMUNITY): Payer: Medicare Other

## 2018-07-03 ENCOUNTER — Encounter (HOSPITAL_COMMUNITY)
Admission: AD | Disposition: A | Discharge status: Home Health | Payer: Self-pay | Source: Other Acute Inpatient Hospital | Attending: Orthopedic Surgery

## 2018-07-03 ENCOUNTER — Inpatient Hospital Stay (HOSPITAL_COMMUNITY): Payer: Medicare Other | Admitting: Anesthesiology

## 2018-07-03 ENCOUNTER — Encounter (HOSPITAL_COMMUNITY): Payer: Self-pay | Admitting: *Deleted

## 2018-07-03 ENCOUNTER — Other Ambulatory Visit: Payer: Self-pay

## 2018-07-03 ENCOUNTER — Ambulatory Visit: Payer: Self-pay | Admitting: Orthopedic Surgery

## 2018-07-03 ENCOUNTER — Inpatient Hospital Stay (HOSPITAL_COMMUNITY)
Admission: AD | Admit: 2018-07-03 | Discharge: 2018-07-08 | DRG: 482 | Disposition: A | Discharge status: Home Health | Payer: Medicare Other | Source: Other Acute Inpatient Hospital | Attending: Orthopedic Surgery | Admitting: Orthopedic Surgery

## 2018-07-03 DIAGNOSIS — Z79890 Hormone replacement therapy (postmenopausal): Secondary | ICD-10-CM | POA: Diagnosis not present

## 2018-07-03 DIAGNOSIS — Z6825 Body mass index (BMI) 25.0-25.9, adult: Secondary | ICD-10-CM

## 2018-07-03 DIAGNOSIS — S72012A Unspecified intracapsular fracture of left femur, initial encounter for closed fracture: Principal | ICD-10-CM | POA: Diagnosis present

## 2018-07-03 DIAGNOSIS — S72002A Fracture of unspecified part of neck of left femur, initial encounter for closed fracture: Secondary | ICD-10-CM | POA: Diagnosis present

## 2018-07-03 DIAGNOSIS — Z419 Encounter for procedure for purposes other than remedying health state, unspecified: Secondary | ICD-10-CM

## 2018-07-03 DIAGNOSIS — E669 Obesity, unspecified: Secondary | ICD-10-CM | POA: Diagnosis present

## 2018-07-03 DIAGNOSIS — W19XXXA Unspecified fall, initial encounter: Secondary | ICD-10-CM | POA: Diagnosis present

## 2018-07-03 DIAGNOSIS — Z87891 Personal history of nicotine dependence: Secondary | ICD-10-CM | POA: Diagnosis not present

## 2018-07-03 DIAGNOSIS — Z96659 Presence of unspecified artificial knee joint: Secondary | ICD-10-CM | POA: Diagnosis present

## 2018-07-03 DIAGNOSIS — Z1159 Encounter for screening for other viral diseases: Secondary | ICD-10-CM | POA: Diagnosis not present

## 2018-07-03 DIAGNOSIS — Z7902 Long term (current) use of antithrombotics/antiplatelets: Secondary | ICD-10-CM | POA: Diagnosis not present

## 2018-07-03 DIAGNOSIS — Z8543 Personal history of malignant neoplasm of ovary: Secondary | ICD-10-CM | POA: Diagnosis not present

## 2018-07-03 DIAGNOSIS — Z880 Allergy status to penicillin: Secondary | ICD-10-CM | POA: Diagnosis not present

## 2018-07-03 DIAGNOSIS — Z7901 Long term (current) use of anticoagulants: Secondary | ICD-10-CM

## 2018-07-03 DIAGNOSIS — E039 Hypothyroidism, unspecified: Secondary | ICD-10-CM | POA: Diagnosis present

## 2018-07-03 DIAGNOSIS — M25552 Pain in left hip: Secondary | ICD-10-CM | POA: Diagnosis present

## 2018-07-03 DIAGNOSIS — D638 Anemia in other chronic diseases classified elsewhere: Secondary | ICD-10-CM | POA: Diagnosis present

## 2018-07-03 DIAGNOSIS — R339 Retention of urine, unspecified: Secondary | ICD-10-CM | POA: Diagnosis not present

## 2018-07-03 DIAGNOSIS — Z9889 Other specified postprocedural states: Secondary | ICD-10-CM

## 2018-07-03 DIAGNOSIS — Z8781 Personal history of (healed) traumatic fracture: Secondary | ICD-10-CM

## 2018-07-03 DIAGNOSIS — Z881 Allergy status to other antibiotic agents status: Secondary | ICD-10-CM | POA: Diagnosis not present

## 2018-07-03 DIAGNOSIS — Z9221 Personal history of antineoplastic chemotherapy: Secondary | ICD-10-CM | POA: Diagnosis not present

## 2018-07-03 HISTORY — PX: HIP PINNING,CANNULATED: SHX1758

## 2018-07-03 HISTORY — DX: Fracture of unspecified part of neck of left femur, initial encounter for closed fracture: S72.002A

## 2018-07-03 LAB — SARS CORONAVIRUS 2 BY RT PCR (HOSPITAL ORDER, PERFORMED IN ~~LOC~~ HOSPITAL LAB): SARS Coronavirus 2: NEGATIVE

## 2018-07-03 LAB — TYPE AND SCREEN
ABO/RH(D): O NEG
Antibody Screen: NEGATIVE

## 2018-07-03 LAB — POCT I-STAT 4, (NA,K, GLUC, HGB,HCT)
Glucose, Bld: 111 mg/dL — ABNORMAL HIGH (ref 70–99)
HCT: 36 % (ref 36.0–46.0)
Hemoglobin: 12.2 g/dL (ref 12.0–15.0)
Potassium: 3.5 mmol/L (ref 3.5–5.1)
Sodium: 142 mmol/L (ref 135–145)

## 2018-07-03 SURGERY — FIXATION, FEMUR, NECK, PERCUTANEOUS, USING SCREW
Anesthesia: General | Laterality: Left

## 2018-07-03 MED ORDER — POVIDONE-IODINE 10 % EX SWAB
2.0000 "application " | Freq: Once | CUTANEOUS | Status: AC
  Administered 2018-07-03: 2 via TOPICAL

## 2018-07-03 MED ORDER — MAGNESIUM CITRATE PO SOLN: 1.0000 | Freq: Once | ORAL | Status: DC | PRN

## 2018-07-03 MED ORDER — ATORVASTATIN CALCIUM 20 MG PO TABS
20.0000 mg | ORAL_TABLET | Freq: Every day | ORAL | Status: DC
  Administered 2018-07-03 – 2018-07-08 (×6): 20 mg via ORAL
  Filled 2018-07-03 (×6): qty 1

## 2018-07-03 MED ORDER — CEFAZOLIN SODIUM-DEXTROSE 2-4 GM/100ML-% IV SOLN
2.0000 g | INTRAVENOUS | Status: AC
  Administered 2018-07-03: 2 g via INTRAVENOUS
  Filled 2018-07-03: qty 100

## 2018-07-03 MED ORDER — ACETAMINOPHEN 325 MG PO TABS
325.0000 mg | ORAL_TABLET | Freq: Four times a day (QID) | ORAL | Status: DC | PRN
  Administered 2018-07-06 – 2018-07-08 (×5): 650 mg via ORAL
  Filled 2018-07-03 (×5): qty 2

## 2018-07-03 MED ORDER — SUGAMMADEX SODIUM 200 MG/2ML IV SOLN
INTRAVENOUS | Status: DC | PRN
  Administered 2018-07-03: 150 mg via INTRAVENOUS

## 2018-07-03 MED ORDER — ENOXAPARIN SODIUM 40 MG/0.4ML ~~LOC~~ SOLN
40.0000 mg | SUBCUTANEOUS | Status: DC
  Administered 2018-07-04 – 2018-07-08 (×5): 40 mg via SUBCUTANEOUS
  Filled 2018-07-03 (×5): qty 0.4

## 2018-07-03 MED ORDER — CEFAZOLIN SODIUM-DEXTROSE 2-4 GM/100ML-% IV SOLN
2.0000 g | Freq: Four times a day (QID) | INTRAVENOUS | Status: AC
  Administered 2018-07-03 – 2018-07-04 (×2): 2 g via INTRAVENOUS
  Filled 2018-07-03 (×2): qty 100

## 2018-07-03 MED ORDER — POLYETHYLENE GLYCOL 3350 17 G PO PACK
17.0000 g | PACK | Freq: Every day | ORAL | Status: DC | PRN
  Administered 2018-07-07: 17 g via ORAL
  Filled 2018-07-03: qty 1

## 2018-07-03 MED ORDER — PHENOL 1.4 % MT LIQD: 1.0000 | OROMUCOSAL | Status: DC | PRN

## 2018-07-03 MED ORDER — LOSARTAN POTASSIUM 25 MG PO TABS
25.0000 mg | ORAL_TABLET | Freq: Every day | ORAL | Status: DC
  Administered 2018-07-04 – 2018-07-08 (×5): 25 mg via ORAL
  Filled 2018-07-03 (×5): qty 1

## 2018-07-03 MED ORDER — METOCLOPRAMIDE HCL 5 MG PO TABS: 5.0000 mg | ORAL_TABLET | Freq: Three times a day (TID) | ORAL | Status: DC | PRN

## 2018-07-03 MED ORDER — DULOXETINE HCL 30 MG PO CPEP
30.0000 mg | ORAL_CAPSULE | Freq: Two times a day (BID) | ORAL | Status: DC
  Administered 2018-07-03 – 2018-07-08 (×9): 30 mg via ORAL
  Filled 2018-07-03 (×10): qty 1

## 2018-07-03 MED ORDER — ACETAMINOPHEN 500 MG PO TABS
500.0000 mg | ORAL_TABLET | Freq: Four times a day (QID) | ORAL | Status: AC
  Administered 2018-07-03 – 2018-07-04 (×3): 500 mg via ORAL
  Filled 2018-07-03 (×3): qty 1

## 2018-07-03 MED ORDER — FENTANYL CITRATE (PF) 100 MCG/2ML IJ SOLN
INTRAMUSCULAR | Status: AC
  Filled 2018-07-03: qty 2

## 2018-07-03 MED ORDER — FENTANYL CITRATE (PF) 100 MCG/2ML IJ SOLN
INTRAMUSCULAR | Status: DC | PRN
  Administered 2018-07-03: 25 ug via INTRAVENOUS
  Administered 2018-07-03: 50 ug via INTRAVENOUS
  Administered 2018-07-03: 25 ug via INTRAVENOUS

## 2018-07-03 MED ORDER — HYDROCODONE-ACETAMINOPHEN 7.5-325 MG PO TABS: 1.0000 | ORAL_TABLET | ORAL | Status: DC | PRN

## 2018-07-03 MED ORDER — SODIUM CHLORIDE 0.9% IV SOLUTION: Freq: Once | INTRAVENOUS | Status: DC

## 2018-07-03 MED ORDER — BUPIVACAINE HCL (PF) 0.25 % IJ SOLN
INTRAMUSCULAR | Status: DC | PRN
  Administered 2018-07-03: 20 mL

## 2018-07-03 MED ORDER — AMLODIPINE BESYLATE 5 MG PO TABS
5.0000 mg | ORAL_TABLET | Freq: Every day | ORAL | Status: DC
  Administered 2018-07-03 – 2018-07-08 (×6): 5 mg via ORAL
  Filled 2018-07-03 (×6): qty 1

## 2018-07-03 MED ORDER — LIDOCAINE 2% (20 MG/ML) 5 ML SYRINGE
INTRAMUSCULAR | Status: DC | PRN
  Administered 2018-07-03: 50 mg via INTRAVENOUS

## 2018-07-03 MED ORDER — HYDRALAZINE HCL 20 MG/ML IJ SOLN
INTRAMUSCULAR | Status: DC | PRN
  Administered 2018-07-03: 2 mg via INTRAVENOUS
  Administered 2018-07-03: 5 mg via INTRAVENOUS

## 2018-07-03 MED ORDER — CHLORHEXIDINE GLUCONATE 4 % EX LIQD: 60.0000 mL | Freq: Once | CUTANEOUS | Status: DC

## 2018-07-03 MED ORDER — DEXAMETHASONE SODIUM PHOSPHATE 10 MG/ML IJ SOLN
INTRAMUSCULAR | Status: DC | PRN
  Administered 2018-07-03: 6 mg via INTRAVENOUS

## 2018-07-03 MED ORDER — ONDANSETRON HCL 4 MG/2ML IJ SOLN: 4.0000 mg | Freq: Once | INTRAMUSCULAR | Status: DC | PRN

## 2018-07-03 MED ORDER — METOCLOPRAMIDE HCL 5 MG/ML IJ SOLN: 5.0000 mg | Freq: Three times a day (TID) | INTRAMUSCULAR | Status: DC | PRN

## 2018-07-03 MED ORDER — LEVOTHYROXINE SODIUM 50 MCG PO TABS
175.0000 ug | ORAL_TABLET | Freq: Every day | ORAL | Status: DC
  Administered 2018-07-04 – 2018-07-08 (×5): 175 ug via ORAL
  Filled 2018-07-03 (×5): qty 1

## 2018-07-03 MED ORDER — GEMFIBROZIL 600 MG PO TABS
600.0000 mg | ORAL_TABLET | Freq: Two times a day (BID) | ORAL | Status: DC
  Administered 2018-07-03 – 2018-07-08 (×9): 600 mg via ORAL
  Filled 2018-07-03 (×10): qty 1

## 2018-07-03 MED ORDER — ROCURONIUM BROMIDE 10 MG/ML (PF) SYRINGE
PREFILLED_SYRINGE | INTRAVENOUS | Status: DC | PRN
  Administered 2018-07-03: 40 mg via INTRAVENOUS

## 2018-07-03 MED ORDER — FERROUS SULFATE 325 (65 FE) MG PO TABS
325.0000 mg | ORAL_TABLET | Freq: Three times a day (TID) | ORAL | Status: DC
  Administered 2018-07-04 – 2018-07-08 (×10): 325 mg via ORAL
  Filled 2018-07-03 (×10): qty 1

## 2018-07-03 MED ORDER — LORATADINE 10 MG PO TABS
10.0000 mg | ORAL_TABLET | Freq: Every day | ORAL | Status: DC
  Administered 2018-07-03 – 2018-07-08 (×6): 10 mg via ORAL
  Filled 2018-07-03 (×6): qty 1

## 2018-07-03 MED ORDER — MENTHOL 3 MG MT LOZG: 1.0000 | LOZENGE | OROMUCOSAL | Status: DC | PRN

## 2018-07-03 MED ORDER — MEPERIDINE HCL 50 MG/ML IJ SOLN: 6.2500 mg | INTRAMUSCULAR | Status: DC | PRN

## 2018-07-03 MED ORDER — ATENOLOL 25 MG PO TABS
12.5000 mg | ORAL_TABLET | Freq: Every day | ORAL | Status: DC
  Administered 2018-07-03 – 2018-07-08 (×6): 12.5 mg via ORAL
  Filled 2018-07-03 (×6): qty 1

## 2018-07-03 MED ORDER — LABETALOL HCL 5 MG/ML IV SOLN
INTRAVENOUS | Status: AC
  Filled 2018-07-03: qty 4

## 2018-07-03 MED ORDER — FENTANYL CITRATE (PF) 100 MCG/2ML IJ SOLN: 25.0000 ug | INTRAMUSCULAR | Status: DC | PRN

## 2018-07-03 MED ORDER — SUGAMMADEX SODIUM 200 MG/2ML IV SOLN
INTRAVENOUS | Status: AC
  Filled 2018-07-03: qty 2

## 2018-07-03 MED ORDER — ONDANSETRON HCL 4 MG/2ML IJ SOLN: 4.0000 mg | Freq: Four times a day (QID) | INTRAMUSCULAR | Status: DC | PRN

## 2018-07-03 MED ORDER — DOCUSATE SODIUM 100 MG PO CAPS
100.0000 mg | ORAL_CAPSULE | Freq: Two times a day (BID) | ORAL | Status: DC
  Administered 2018-07-03 – 2018-07-08 (×9): 100 mg via ORAL
  Filled 2018-07-03 (×10): qty 1

## 2018-07-03 MED ORDER — BISACODYL 10 MG RE SUPP: 10.0000 mg | Freq: Every day | RECTAL | Status: DC | PRN

## 2018-07-03 MED ORDER — ONDANSETRON HCL 4 MG/2ML IJ SOLN
INTRAMUSCULAR | Status: DC | PRN
  Administered 2018-07-03: 4 mg via INTRAVENOUS

## 2018-07-03 MED ORDER — OXYCODONE HCL 5 MG/5ML PO SOLN: 5.0000 mg | Freq: Once | ORAL | Status: DC | PRN

## 2018-07-03 MED ORDER — KETOROLAC TROMETHAMINE 15 MG/ML IJ SOLN
7.5000 mg | Freq: Four times a day (QID) | INTRAMUSCULAR | Status: AC
  Administered 2018-07-03 – 2018-07-04 (×4): 7.5 mg via INTRAVENOUS
  Filled 2018-07-03 (×4): qty 1

## 2018-07-03 MED ORDER — POTASSIUM CHLORIDE IN NACL 20-0.9 MEQ/L-% IV SOLN
INTRAVENOUS | Status: DC
  Administered 2018-07-03 – 2018-07-06 (×5): via INTRAVENOUS
  Filled 2018-07-03 (×6): qty 1000

## 2018-07-03 MED ORDER — CYANOCOBALAMIN 1000 MCG/ML IJ SOLN: 1000.0000 ug | INTRAMUSCULAR | Status: DC

## 2018-07-03 MED ORDER — LABETALOL HCL 5 MG/ML IV SOLN
INTRAVENOUS | Status: DC | PRN
  Administered 2018-07-03 (×4): 2.5 mg via INTRAVENOUS
  Administered 2018-07-03: 5 mg via INTRAVENOUS

## 2018-07-03 MED ORDER — PROPOFOL 10 MG/ML IV BOLUS
INTRAVENOUS | Status: AC
  Filled 2018-07-03: qty 20

## 2018-07-03 MED ORDER — ACETAMINOPHEN 325 MG PO TABS: 325.0000 mg | ORAL_TABLET | ORAL | Status: DC | PRN

## 2018-07-03 MED ORDER — OXYCODONE HCL 5 MG PO TABS: 5.0000 mg | ORAL_TABLET | Freq: Once | ORAL | Status: DC | PRN

## 2018-07-03 MED ORDER — MORPHINE SULFATE (PF) 2 MG/ML IV SOLN: 0.5000 mg | INTRAVENOUS | Status: DC | PRN

## 2018-07-03 MED ORDER — LETROZOLE 2.5 MG PO TABS
2.5000 mg | ORAL_TABLET | Freq: Every day | ORAL | Status: DC
  Administered 2018-07-04 – 2018-07-08 (×5): 2.5 mg via ORAL
  Filled 2018-07-03 (×5): qty 1

## 2018-07-03 MED ORDER — LIDOCAINE 2% (20 MG/ML) 5 ML SYRINGE
INTRAMUSCULAR | Status: AC
  Filled 2018-07-03: qty 5

## 2018-07-03 MED ORDER — ACETAMINOPHEN 160 MG/5ML PO SOLN: 325.0000 mg | ORAL | Status: DC | PRN

## 2018-07-03 MED ORDER — ONDANSETRON HCL 4 MG PO TABS: 4.0000 mg | ORAL_TABLET | Freq: Four times a day (QID) | ORAL | Status: DC | PRN

## 2018-07-03 MED ORDER — SUCCINYLCHOLINE CHLORIDE 200 MG/10ML IV SOSY
PREFILLED_SYRINGE | INTRAVENOUS | Status: AC
  Filled 2018-07-03: qty 10

## 2018-07-03 MED ORDER — DEXAMETHASONE SODIUM PHOSPHATE 10 MG/ML IJ SOLN
INTRAMUSCULAR | Status: AC
  Filled 2018-07-03: qty 1

## 2018-07-03 MED ORDER — HYDROCODONE-ACETAMINOPHEN 5-325 MG PO TABS
1.0000 | ORAL_TABLET | ORAL | Status: DC | PRN
  Administered 2018-07-03 – 2018-07-04 (×2): 1 via ORAL
  Filled 2018-07-03 (×2): qty 1

## 2018-07-03 MED ORDER — ONDANSETRON HCL 4 MG/2ML IJ SOLN
INTRAMUSCULAR | Status: AC
  Filled 2018-07-03: qty 2

## 2018-07-03 MED ORDER — PROPOFOL 10 MG/ML IV BOLUS
INTRAVENOUS | Status: DC | PRN
  Administered 2018-07-03: 100 mg via INTRAVENOUS

## 2018-07-03 MED ORDER — GLYCOPYRROLATE PF 0.2 MG/ML IJ SOSY
PREFILLED_SYRINGE | INTRAMUSCULAR | Status: DC | PRN
  Administered 2018-07-03: .2 mg via INTRAVENOUS

## 2018-07-03 MED ORDER — ALUM & MAG HYDROXIDE-SIMETH 200-200-20 MG/5ML PO SUSP: 30.0000 mL | ORAL | Status: DC | PRN

## 2018-07-03 MED ORDER — LACTATED RINGERS IV SOLN
INTRAVENOUS | Status: DC
  Administered 2018-07-03 (×2): via INTRAVENOUS

## 2018-07-03 MED ORDER — BUPIVACAINE HCL (PF) 0.25 % IJ SOLN
INTRAMUSCULAR | Status: AC
  Filled 2018-07-03: qty 30

## 2018-07-03 SURGICAL SUPPLY — 41 items
BAG SPEC THK2 15X12 ZIP CLS (MISCELLANEOUS) ×1
BAG ZIPLOCK 12X15 (MISCELLANEOUS) ×3 IMPLANT
BIT DRILL 4.8X300 (BIT) ×1 IMPLANT
BIT DRILL 4.8X300MM (BIT) ×1
BNDG COHESIVE 6X5 TAN STRL LF (GAUZE/BANDAGES/DRESSINGS) ×3 IMPLANT
CLOSURE STERI-STRIP 1/2X4 (GAUZE/BANDAGES/DRESSINGS) ×1
CLOSURE WOUND 1/2 X4 (GAUZE/BANDAGES/DRESSINGS) ×1
CLSR STERI-STRIP ANTIMIC 1/2X4 (GAUZE/BANDAGES/DRESSINGS) ×1 IMPLANT
COVER SURGICAL LIGHT HANDLE (MISCELLANEOUS) ×3 IMPLANT
COVER WAND RF STERILE (DRAPES) IMPLANT
DECANTER SPIKE VIAL GLASS SM (MISCELLANEOUS) ×3 IMPLANT
DRAPE STERI IOBAN 125X83 (DRAPES) ×3 IMPLANT
DRSG AQUACEL AG ADV 3.5X 4 (GAUZE/BANDAGES/DRESSINGS) ×4 IMPLANT
DURAPREP 26ML APPLICATOR (WOUND CARE) ×3 IMPLANT
ELECT REM PT RETURN 15FT ADLT (MISCELLANEOUS) ×3 IMPLANT
FACESHIELD WRAPAROUND (MASK) ×6 IMPLANT
FACESHIELD WRAPAROUND OR TEAM (MASK) ×2 IMPLANT
GLOVE BIOGEL PI IND STRL 8 (GLOVE) ×2 IMPLANT
GLOVE BIOGEL PI INDICATOR 8 (GLOVE) ×4
GLOVE ORTHO TXT STRL SZ7.5 (GLOVE) ×3 IMPLANT
GLOVE SURG SS PI 8.0 STRL IVOR (GLOVE) ×3 IMPLANT
GOWN STRL REUS W/TWL LRG LVL3 (GOWN DISPOSABLE) ×3 IMPLANT
KIT BASIN OR (CUSTOM PROCEDURE TRAY) ×3 IMPLANT
KIT TURNOVER KIT A (KITS) IMPLANT
NEEDLE HYPO 22GX1.5 SAFETY (NEEDLE) ×3 IMPLANT
NS IRRIG 1000ML POUR BTL (IV SOLUTION) ×3 IMPLANT
PACK GENERAL/GYN (CUSTOM PROCEDURE TRAY) ×3 IMPLANT
PIN GUIDE DRILL TIP 2.8X300 (DRILL) ×6 IMPLANT
PROTECTOR NERVE ULNAR (MISCELLANEOUS) ×3 IMPLANT
SCREW 8.0X80MMX16 (Screw) ×2 IMPLANT
SCREW CANN 8.0X85 HIP (Screw) ×2 IMPLANT
SCREW CANNULATED 8.0X75MM (Screw) ×4 IMPLANT
STRIP CLOSURE SKIN 1/2X4 (GAUZE/BANDAGES/DRESSINGS) ×2 IMPLANT
SUT VIC AB 0 CT1 27 (SUTURE) ×3
SUT VIC AB 0 CT1 27XBRD ANTBC (SUTURE) ×1 IMPLANT
SUT VIC AB 3-0 SH 8-18 (SUTURE) ×3 IMPLANT
SYR 20CC LL (SYRINGE) ×3 IMPLANT
TOWEL OR 17X26 10 PK STRL BLUE (TOWEL DISPOSABLE) ×3 IMPLANT
TOWEL OR NON WOVEN STRL DISP B (DISPOSABLE) ×3 IMPLANT
TRAY FOLEY MTR SLVR 16FR STAT (SET/KITS/TRAYS/PACK) IMPLANT
WATER STERILE IRR 1000ML POUR (IV SOLUTION) ×3 IMPLANT

## 2018-07-03 NOTE — Discharge Instructions (Signed)
Diet: As you were doing prior to hospitalization   Shower:  May shower but keep the wounds dry, use an occlusive plastic wrap, NO SOAKING IN TUB.  If the bandage gets wet, change with a clean dry gauze.  If you have a splint on, leave the splint in place and keep the splint dry with a plastic bag.  Dressing:  You may change your dressing 3-5 days after surgery, unless you have a splint.  If you have a splint, then just leave the splint in place and we will change your bandages during your first follow-up appointment.    If you had hand or foot surgery, we will plan to remove your stitches in about 2 weeks in the office.  For all other surgeries, there are sticky tapes (steri-strips) on your wounds and all the stitches are absorbable.  Leave the steri-strips in place when changing your dressings, they will peel off with time, usually 2-3 weeks.  Activity:  Increase activity slowly as tolerated, but follow the weight bearing instructions below.  The rules on driving is that you can not be taking narcotics while you drive, and you must feel in control of the vehicle.    Weight Bearing:   Touch toe weight bearing left leg.    To prevent constipation: you may use a stool softener such as -  Colace (over the counter) 100 mg by mouth twice a day  Drink plenty of fluids (prune juice may be helpful) and high fiber foods Miralax (over the counter) for constipation as needed.    Itching:  If you experience itching with your medications, try taking only a single pain pill, or even half a pain pill at a time.  You may take up to 10 pain pills per day, and you can also use benadryl over the counter for itching or also to help with sleep.   Precautions:  If you experience chest pain or shortness of breath - call 911 immediately for transfer to the hospital emergency department!!  If you develop a fever greater that 101 F, purulent drainage from wound, increased redness or drainage from wound, or calf pain --  Call the office at [phone]                                                Follow- Up Appointment:  Please call for an appointment to be seen in 2 weeks [place] - [phone]

## 2018-07-03 NOTE — Anesthesia Preprocedure Evaluation (Signed)
Anesthesia Evaluation  Patient identified by MRN, date of birth, ID band Patient awake    Reviewed: Allergy & Precautions, NPO status , Patient's Chart, lab work & pertinent test results  History of Anesthesia Complications Negative for: history of anesthetic complications  Airway Mallampati: II  TM Distance: >3 FB Neck ROM: Full    Dental  (+) Partial Upper, Dental Advisory Given, Missing   Pulmonary neg pulmonary ROS, former smoker,    breath sounds clear to auscultation       Cardiovascular hypertension, Pt. on medications  Rhythm:Regular Rate:Normal  02/2018 ECHO: Hyperdynamic left ventricular systolic function, ejection fraction > 53%  Diastolic dysfunction - grade II (elevated filling pressures)  Normal right ventricular systolic function  Mitral annular calcification  Aortic sclerosis   Neuro/Psych  Neuromuscular disease    GI/Hepatic negative GI ROS, Neg liver ROS,   Endo/Other  Hypothyroidism   Renal/GU negative Renal ROS     Musculoskeletal   Abdominal   Peds  Hematology  (+) Blood dyscrasia (Hb 9.9), anemia ,   Anesthesia Other Findings   Reproductive/Obstetrics Ovarian cancer                            Anesthesia Physical  Anesthesia Plan  ASA: III  Anesthesia Plan: General   Post-op Pain Management:    Induction: Intravenous  PONV Risk Score and Plan: 3 and Ondansetron, Dexamethasone and Treatment may vary due to age or medical condition  Airway Management Planned: Oral ETT and LMA  Additional Equipment:   Intra-op Plan:   Post-operative Plan: Extubation in OR  Informed Consent: I have reviewed the patients History and Physical, chart, labs and discussed the procedure including the risks, benefits and alternatives for the proposed anesthesia with the patient or authorized representative who has indicated his/her understanding and acceptance.     Dental  advisory given  Plan Discussed with: CRNA, Surgeon and Anesthesiologist  Anesthesia Plan Comments:         Anesthesia Quick Evaluation

## 2018-07-03 NOTE — Transfer of Care (Signed)
Immediate Anesthesia Transfer of Care Note  Patient: Janice Rogers  Procedure(s) Performed: CANNULATED HIP PINNING (Left )  Patient Location: PACU  Anesthesia Type:General  Level of Consciousness: awake, patient cooperative and confused  Airway & Oxygen Therapy: Patient Spontanous Breathing and Patient connected to face mask oxygen  Post-op Assessment: Report given to RN, Post -op Vital signs reviewed and stable and Patient moving all extremities X 4  Post vital signs: stable  Last Vitals:  Vitals Value Taken Time  BP 160/80 07/03/18 1900  Temp    Pulse 88 07/03/18 1900  Resp 18 07/03/18 1901  SpO2 95 % 07/03/18 1900  Vitals shown include unvalidated device data.  Last Pain:  Vitals:   07/03/18 1232  TempSrc: Oral         Complications: No apparent anesthesia complications

## 2018-07-03 NOTE — Op Note (Addendum)
07/03/2018  6:38 PM  PATIENT:  Janice Rogers    PRE-OPERATIVE DIAGNOSIS:  fractured left hip, femoral neck, valgus impacted  POST-OPERATIVE DIAGNOSIS:  Same  PROCEDURE:  CANNULATED HIP PINNING, LEFT  SURGEON:  Johnny Bridge, MD  PHYSICIAN ASSISTANT: Joya Gaskins, OPA-C, present and scrubbed throughout the case, critical for completion in a timely fashion, and for retraction, instrumentation, and closure.  ANESTHESIA:   General  ESTIMATED BLOOD LOSS: minimal  PREOPERATIVE INDICATIONS:  Janice Rogers is a  85 y.o. female who fell and was found to have a diagnosis of fractured left hip who elected for surgical management.  She was transferred from [facility].  She had a previous distal femoral periprosthetic fracture that underwent surgical fixation about 3 months ago, and then had another fall and broke her femoral neck.  The risks benefits and alternatives were discussed with the patient preoperatively including but not limited to the risks of infection, bleeding, nerve injury, cardiopulmonary complications, blood clots, malunion, nonunion, avascular necrosis, the need for revision surgery, the potential for conversion to hemiarthroplasty, among others, and the patient was willing to proceed.  OPERATIVE IMPLANTS: 8.0 mm cannulated screws x3, titanium from [employer]  OPERATIVE FINDINGS: Clinical osteoporosis with weak bone, proximal femur.  OPERATIVE PROCEDURE: The patient was brought to the operating room and placed in supine position. IV antibiotics were given. General anesthesia administered.  The patient was placed on the Hana bed. The operative extremity was positioned, without any significant reduction maneuver and was prepped and draped in usual sterile fashion.  Time out was performed.  Small incision was made distal to the greater trochanter, and 3 guidewires were introduced Into an inverted triangle configuration. The lengths were measured. The  reduction was slightly valgus, and near-anatomic. I opened the cortex with a cannulated drill, and then placed the screws into position. Satisfactory fixation was achieved.  Placement of the screws was somewhat challenging because of the existing femoral nail, which caused the start points to be slightly more convergent, and I wanted to have adequate spread to support the head, I was just barely juxtaposed to the edge of the femoral neck particularly superiorly.  I had excellent fixation in the head, I do not think I was bicortical with the trajectory of the screw, but even if so I think that the challenge of getting adequate spread with fixation in stable position was of the utmost importance.  The wounds were irrigated copiously, and repaired with Vicryl with Steri-Strips and sterile gauze. There no complications and the patient tolerated the procedure well.  The patient will be touch toe weight bearing, and will be on Lovenox for a period of 4 weeks after discharge for DVT prophylaxis.

## 2018-07-03 NOTE — Anesthesia Procedure Notes (Signed)
Procedure Name: Intubation Date/Time: 07/03/2018 5:16 PM Performed by: Anne Fu, CRNA Pre-anesthesia Checklist: Patient identified, Emergency Drugs available, Suction available, Patient being monitored and Timeout performed Patient Re-evaluated:Patient Re-evaluated prior to induction Oxygen Delivery Method: Circle system utilized Preoxygenation: Pre-oxygenation with 100% oxygen Induction Type: IV induction Ventilation: Mask ventilation without difficulty Laryngoscope Size: Mac and 3 Grade View: Grade I Tube type: Oral Tube size: 7.5 mm Number of attempts: 1 Airway Equipment and Method: Stylet Placement Confirmation: ETT inserted through vocal cords under direct vision,  positive ETCO2 and breath sounds checked- equal and bilateral Secured at: 21 cm Tube secured with: Tape Dental Injury: Teeth and Oropharynx as per pre-operative assessment

## 2018-07-03 NOTE — H&P (Signed)
PREOPERATIVE H&P  Chief Complaint: left hip pain  HPI: Janice Rogers is a 85 y.o. female who presents for preoperative history and physical with a diagnosis of left femoral neck fracture. Symptoms are rated as moderate to severe, and have been worsening.  This is significantly impairing activities of daily living.  She has elected for surgical management.   She was admitted to [name] rocking him and requested for transfer because I have operated on her left leg about 2-1/2 months ago, when she had a periprosthetic distal femur fracture.  Pain worse with movement, better with rest and pain medication.  Past Medical History:  Diagnosis Date  . Acute blood loss anemia 04/14/2018  . Anemia of chronic disease 04/14/2018  . B12 deficiency 08/05/2015  . Cancer ([place])   . Chemotherapy-induced neuropathy ([place]) 04/27/2016  . Mental status alteration 04/14/2018  . Ovarian cancer on left [facility]) 08/05/2015   Past Surgical History:  Procedure Laterality Date  . catarct surgery    . FEMUR IM NAIL Left 04/13/2018   Procedure: INTRAMEDULLARY (IM) RETROGRADE FEMORAL NAILING;  Surgeon: Marchia Bond, MD;  Location: [place];  Service: Orthopedics;  Laterality: Left;  . REPLACEMENT TOTAL KNEE     Social History   Socioeconomic History  . Marital status: Married    Spouse name: Not on file  . Number of children: Not on file  . Years of education: Not on file  . Highest education level: Not on file  Occupational History  . Not on file  Social Needs  . Financial resource strain: Not on file  . Food insecurity    Worry: Not on file    Inability: Not on file  . Transportation needs    Medical: Not on file    Non-medical: Not on file  Tobacco Use  . Smoking status: Former Research scientist (life sciences)  . Smokeless tobacco: Never Used  Substance and Sexual Activity  . Alcohol use: No  . Drug use: No  . Sexual activity: Never  Lifestyle  . Physical activity    Days per week: Not on file    Minutes per session: Not on  file  . Stress: Not on file  Relationships  . Social Herbalist on phone: Not on file    Gets together: Not on file    Attends religious service: Not on file    Active member of club or organization: Not on file    Attends meetings of clubs or organizations: Not on file    Relationship status: Not on file  Other Topics Concern  . Not on file  Social History Narrative  . Not on file   History reviewed. No pertinent family history. Allergies  Allergen Reactions  . Doxycycline Rash and Hives  . Gemcitabine Shortness Of Breath  . Penicillins Hives and Rash    No reaction to ancef noted 4/10 during surgical IM nail (per Mardelle Matte Md)    Prior to Admission medications   Medication Sig Start Date End Date Taking? Authorizing Provider  atenolol (TENORMIN) 25 MG tablet Take 12.5 mg by mouth daily.   Yes [provider]  acetaminophen (TYLENOL) 325 MG tablet Take 2 tablets (650 mg total) by mouth every 6 (six) hours as needed. 04/17/18   Marchia Bond, MD  amLODipine (NORVASC) 5 MG tablet Take 5 mg by mouth daily.     [provider]  atorvastatin (LIPITOR) 20 MG tablet Take 20 mg by mouth daily.     [provider]  cetirizine (ZYRTEC) 10 MG tablet Take 10 mg by mouth daily. 03/04/18   [provider]  clopidogrel (PLAVIX) 75 MG tablet Take 75 mg by mouth daily. 03/07/18   [provider]  cyanocobalamin (,VITAMIN B-12,) 1000 MCG/ML injection Inject 1 mL (1,000 mcg total) into the muscle every 30 (thirty) days. 09/01/17   Derek Jack, MD  DULoxetine (CYMBALTA) 30 MG capsule Take 30 mg by mouth 2 (two) times daily. 02/04/18   [provider]  enoxaparin (LOVENOX) 40 MG/0.4ML injection Inject 0.4 mLs (40 mg total) into the skin daily. 04/17/18   Marchia Bond, MD  gemfibrozil (LOPID) 600 MG tablet Take 600 mg by mouth 2 (two) times daily.     [provider]  letrozole (FEMARA) 2.5 MG tablet Take 2.5 mg by mouth daily.      [provider]  levothyroxine (SYNTHROID, LEVOTHROID) 175 MCG tablet Take 175 mcg by mouth daily.     [provider]  losartan (COZAAR) 25 MG tablet Take 25 mg by mouth daily. 03/04/18   [provider]  sennosides-docusate sodium (SENOKOT-S) 8.6-50 MG tablet Take 2 tablets by mouth daily. 04/13/18   Marchia Bond, MD     Positive ROS: All other systems have been reviewed and were otherwise negative with the exception of those mentioned in the HPI and as above.  Physical Exam: General: Alert, no acute distress Cardiovascular: No pedal edema Respiratory: No cyanosis, no use of accessory musculature GI: No organomegaly, abdomen is soft and non-tender Skin: No lesions in the area of chief complaint Neurologic: Sensation intact distally Psychiatric: Patient interacts with me, and answers questions, although I get the sense that she may have an element of dementia. Lymphatic: No axillary or cervical lymphadenopathy  MUSCULOSKELETAL: Left leg EHL and FHL are intact, I did not aggressively test the motion, she has pain located around the left groin.  Assessment: Left femoral neck fracture above a retrograde femoral nail   Plan: Plan for Procedure(s): CANNULATED HIP PINNING  The risks benefits and alternatives were discussed with the patient including but not limited to the risks of nonoperative treatment, versus surgical intervention including infection, bleeding, nerve injury,  blood clots, cardiopulmonary complications, nonunion, avascular necrosis, the need for future conversion to arthroplasty, morbidity, mortality, among others, and they were willing to proceed.   Anticipated LOS equal to or greater than 2 midnights due to - Age 20 and older with one or more of the following:  - Obesity  - Expected need for hospital services (PT, OT, Nursing) required for safe  discharge  - Anticipated need for postoperative skilled nursing care or inpatient rehab  -  Active co-morbidities: Anemia    Johnny Bridge, MD Cell [phone]   07/03/2018 1:39 PM

## 2018-07-04 ENCOUNTER — Encounter (HOSPITAL_COMMUNITY): Payer: Self-pay | Admitting: Orthopedic Surgery

## 2018-07-04 LAB — CBC
HCT: 41.3 % (ref 36.0–46.0)
Hemoglobin: 12.4 g/dL (ref 12.0–15.0)
MCH: 27.8 pg (ref 26.0–34.0)
MCHC: 30 g/dL (ref 30.0–36.0)
MCV: 92.6 fL (ref 80.0–100.0)
Platelets: 207 10*3/uL (ref 150–400)
RBC: 4.46 MIL/uL (ref 3.87–5.11)
RDW: 13.7 % (ref 11.5–15.5)
WBC: 8.2 10*3/uL (ref 4.0–10.5)
nRBC: 0 % (ref 0.0–0.2)

## 2018-07-04 LAB — BASIC METABOLIC PANEL
Anion gap: 11 (ref 5–15)
BUN: 17 mg/dL (ref 8–23)
CO2: 22 mmol/L (ref 22–32)
Calcium: 9.1 mg/dL (ref 8.9–10.3)
Chloride: 106 mmol/L (ref 98–111)
Creatinine, Ser: 1 mg/dL (ref 0.44–1.00)
GFR calc Af Amer: 59 mL/min — ABNORMAL LOW (ref >60)
GFR calc non Af Amer: 51 mL/min — ABNORMAL LOW (ref >60)
Glucose, Bld: 182 mg/dL — ABNORMAL HIGH (ref 70–99)
Potassium: 4.2 mmol/L (ref 3.5–5.1)
Sodium: 139 mmol/L (ref 135–145)

## 2018-07-04 LAB — ABO/RH: ABO/RH(D): O NEG

## 2018-07-04 MED ORDER — SODIUM CHLORIDE 0.9 % IV BOLUS
500.0000 mL | Freq: Once | INTRAVENOUS | Status: AC
  Administered 2018-07-04: 500 mL via INTRAVENOUS

## 2018-07-04 NOTE — Progress Notes (Signed)
PT Cancellation Note  Patient Details Name: Janice Rogers MRN: [id number] DOB: 02/12/1928   Cancelled Treatment:     Attempted to see the pt twice this morning. Pt is mildly confused. She reports she continues to need time to eat her breakfast due to "she lost her teeth". Pt does have her dentures in. Pt asked me to leave her alone and come back tomorrow. Will try therapy later today to see if she is willing to participate.   Lelon Mast 07/04/2018, 9:31 AM

## 2018-07-04 NOTE — Progress Notes (Addendum)
Patient ID: Janice Rogers, female   DOB: 1928/07/22, 85 y.o.   MRN: [id number]     Subjective:  Patient reports pain as mild to moderate.  Patient confused but feeling better  Objective:   VITALS:   Vitals:   07/04/18 0506 07/04/18 0650 07/04/18 0804 07/04/18 1015  BP: (!) 160/73   137/65  Pulse: 72   66  Resp: 14   17  Temp: 98.3 F (36.8 C)   98.5 F (36.9 C)  TempSrc:    Oral  SpO2: 100%   100%  Weight:  62.9 kg    Height:   5\' 2"  (1.575 m)     ABD soft Sensation intact distally Dorsiflexion/Plantar flexion intact Incision: dressing C/D/I and no drainage   Lab Results  Component Value Date   WBC 8.2 07/04/2018   HGB 12.4 07/04/2018   HCT 41.3 07/04/2018   MCV 92.6 07/04/2018   PLT 207 07/04/2018   BMET    Component Value Date/Time   NA 139 07/04/2018 0329   K 4.2 07/04/2018 0329   CL 106 07/04/2018 0329   CO2 22 07/04/2018 0329   GLUCOSE 182 (H) 07/04/2018 0329   BUN 17 07/04/2018 0329   CREATININE 1.00 07/04/2018 0329   CALCIUM 9.1 07/04/2018 0329   GFRNONAA 51 (L) 07/04/2018 0329   GFRAA 59 (L) 07/04/2018 0329     Assessment/Plan: 1 Day Post-Op   Principal Problem:   Fracture of femoral neck, left (HCC) Active Problems:   Closed left hip fracture (HCC)   Advance diet Up with therapy Continue plan per medicine TTWB left lower ext Dry dressing PRN Plan for SNF   Lunette Stands 07/04/2018, 1:10 PM  Discussed and agree with above.   Marchia Bond, MD Cell [phone]

## 2018-07-04 NOTE — Evaluation (Signed)
Physical Therapy Evaluation Patient Details Name: TASHICA PROVENCIO MRN: [id number] DOB: 1928-05-09 Today's Date: 07/04/2018   History of Present Illness  ARINA TORRY is a 85 y.o. female who presents for preoperative history and physical with a diagnosis of left femoral neck fracture. S/p L Hip pinning  Clinical Impression  Pt presents with dependencies in mobility secondary to the above diagnosis. Pt has a h/o falls and a previous Left distal femur fx. Pt appeared mildly confused at times and no family present to determine baseline. Pt was not able to maintain TDWB to ambulate. Pt transferred to recliner with mod assist squat pivot to right side. Pt may need SNF placement due to multiple streps, h/o falls and inability to maintain TDWB.  Pt will benefit from skilled PT to maximize mobility and Independence for next venue of care.    Follow Up Recommendations Follow surgeon's recommendation for DC plan and follow-up therapies    Equipment Recommendations  None recommended by PT    Recommendations for Other Services       Precautions / Restrictions Precautions Precautions: Fall Restrictions Weight Bearing Restrictions: Yes LLE Weight Bearing: Touchdown weight bearing      Mobility  Bed Mobility Overal bed mobility: Needs Assistance Bed Mobility: Supine to Sit     Supine to sit: Mod assist;HOB elevated     General bed mobility comments: increased time, max verbal and visual cues for technique, assistance with trunk, able to move LEs over with cues  Transfers Overall transfer level: Needs assistance Equipment used: Rolling walker (2 wheeled) Transfers: Sit to/from [employer] Sit to Stand: Mod assist   Squat pivot transfers: Mod assist     General transfer comment: transfer with RW mod assist, posterior lean and had difficulty maintaining TDWB, squat pivot to right side with mod assist and max cues  Ambulation/Gait Ambulation/Gait  assistance: (unable to maintain TDWB)              Stairs            Wheelchair Mobility    Modified Rankin (Stroke Patients Only)       Balance Overall balance assessment: Needs assistance Sitting-balance support: No upper extremity supported Sitting balance-Leahy Scale: Fair     Standing balance support: Bilateral upper extremity supported Standing balance-Leahy Scale: Poor                               Pertinent Vitals/Pain Pain Assessment: Faces Faces Pain Scale: Hurts a little bit Pain Location: L hip Pain Descriptors / Indicators: Discomfort Pain Intervention(s): Limited activity within patient's tolerance;Monitored during session    Home Living Family/patient expects to be discharged to:: Private residence Living Arrangements: Spouse/significant other Available Help at Discharge: Family Type of Home: House Home Access: Stairs to enter Entrance Stairs-Rails: None Technical brewer of Steps: 5-6 Home Layout: One level Home Equipment: Environmental consultant - 2 wheels      Prior Function Level of Independence: Independent with assistive device(s)               Hand Dominance        Extremity/Trunk Assessment   Upper Extremity Assessment Upper Extremity Assessment: Defer to OT evaluation    Lower Extremity Assessment Lower Extremity Assessment: LLE deficits/detail LLE Deficits / Details: grossly 3/5       Communication   Communication: No difficulties  Cognition Arousal/Alertness: Awake/alert Behavior During Therapy: WFL for tasks assessed/performed Overall Cognitive Status:  No family/caregiver present to determine baseline cognitive functioning                                 General Comments: slow to process, difficulty with sequencing and following multi step commands,      General Comments      Exercises Total Joint Exercises Ankle Circles/Pumps: AROM;Strengthening;Both;10 reps;Supine Quad Sets:  AROM;Strengthening;Left;10 reps;Supine Heel Slides: AAROM;Strengthening;Left;10 reps;Supine Hip ABduction/ADduction: AAROM;Strengthening;Left;10 reps;Supine Long Arc Quad: AROM;Strengthening;Left;10 reps;Seated   Assessment/Plan    PT Assessment Patient needs continued PT services  PT Problem List Decreased strength;Decreased mobility;Decreased safety awareness;Decreased range of motion;Decreased knowledge of precautions;Decreased activity tolerance;Decreased balance       PT Treatment Interventions DME instruction;Therapeutic activities;Gait training;Therapeutic exercise;Patient/family education;Functional mobility training;Neuromuscular re-education;Balance training    PT Goals (Current goals can be found in the Care Plan section)  Acute Rehab PT Goals Patient Stated Goal: To walk again PT Goal Formulation: With patient Time For Goal Achievement: 07/11/18 Potential to Achieve Goals: Good    Frequency Min 5X/week   Barriers to discharge        Co-evaluation               AM-PAC PT "6 Clicks" Mobility  Outcome Measure Help needed turning from your back to your side while in a flat bed without using bedrails?: A Little Help needed moving from lying on your back to sitting on the side of a flat bed without using bedrails?: A Lot Help needed moving to and from a bed to a chair (including a wheelchair)?: A Lot Help needed standing up from a chair using your arms (e.g., wheelchair or bedside chair)?: A Lot Help needed to walk in hospital room?: Total Help needed climbing 3-5 steps with a railing? : Total 6 Click Score: 11    End of Session Equipment Utilized During Treatment: Gait belt Activity Tolerance: Patient tolerated treatment well Patient left: in chair;with call bell/phone within reach Nurse Communication: Mobility status;Weight bearing status(squat pivot no RW can't maintain TDWB) PT Visit Diagnosis: Other abnormalities of gait and mobility (R26.89);History of  falling (Z91.81)    Time: 1610-9604 PT Time Calculation (min) (ACUTE ONLY): 34 min   Charges:   PT Evaluation $PT Eval Low Complexity: 1 Low PT Treatments $Therapeutic Exercise: 8-22 mins        Salathiel Ferrara, PT  Lelon Mast 07/04/2018, 1:10 PM

## 2018-07-04 NOTE — Anesthesia Postprocedure Evaluation (Signed)
Anesthesia Post Note  Patient: Janice Rogers  Procedure(s) Performed: CANNULATED HIP PINNING (Left )     Patient location during evaluation: PACU Anesthesia Type: General Level of consciousness: awake and alert Pain management: pain level controlled Vital Signs Assessment: post-procedure vital signs reviewed and stable Respiratory status: spontaneous breathing, nonlabored ventilation, respiratory function stable and patient connected to nasal cannula oxygen Cardiovascular status: blood pressure returned to baseline and stable Postop Assessment: no apparent nausea or vomiting Anesthetic complications: no    Last Vitals:  Vitals:   07/04/18 0506 07/04/18 1015  BP: (!) 160/73 137/65  Pulse: 72 66  Resp: 14 17  Temp: 36.8 C 36.9 C  SpO2: 100% 100%    Last Pain:  Vitals:   07/04/18 1115  TempSrc:   PainSc: 0-No pain                 Catalina Gravel

## 2018-07-04 NOTE — Progress Notes (Signed)
PT updated frequency of therapy visits to 3x wk due to plans for SNF placement. Encourage pt to get OOB with nursing squat pivot transfers. Pt is currently unable to maintain TDWB to stand. PT to continue to see pt until D/C and will increase frequency as appropriate.

## 2018-07-05 LAB — CBC
HCT: 35.6 % — ABNORMAL LOW (ref 36.0–46.0)
Hemoglobin: 11.2 g/dL — ABNORMAL LOW (ref 12.0–15.0)
MCH: 29.3 pg (ref 26.0–34.0)
MCHC: 31.5 g/dL (ref 30.0–36.0)
MCV: 93.2 fL (ref 80.0–100.0)
Platelets: 168 10*3/uL (ref 150–400)
RBC: 3.82 MIL/uL — ABNORMAL LOW (ref 3.87–5.11)
RDW: 14 % (ref 11.5–15.5)
WBC: 8.9 10*3/uL (ref 4.0–10.5)
nRBC: 0 % (ref 0.0–0.2)

## 2018-07-05 LAB — BASIC METABOLIC PANEL
Anion gap: 11 (ref 5–15)
BUN: 22 mg/dL (ref 8–23)
CO2: 21 mmol/L — ABNORMAL LOW (ref 22–32)
Calcium: 8.9 mg/dL (ref 8.9–10.3)
Chloride: 109 mmol/L (ref 98–111)
Creatinine, Ser: 0.94 mg/dL (ref 0.44–1.00)
GFR calc Af Amer: >60 mL/min (ref >60)
GFR calc non Af Amer: 55 mL/min — ABNORMAL LOW (ref >60)
Glucose, Bld: 102 mg/dL — ABNORMAL HIGH (ref 70–99)
Potassium: 3.8 mmol/L (ref 3.5–5.1)
Sodium: 141 mmol/L (ref 135–145)

## 2018-07-05 MED ORDER — ENOXAPARIN SODIUM 40 MG/0.4ML ~~LOC~~ SOLN: 40.0000 mg | SUBCUTANEOUS | 0 refills | Status: AC

## 2018-07-05 MED ORDER — HALOPERIDOL LACTATE 5 MG/ML IJ SOLN
1.0000 mg | INTRAMUSCULAR | Status: DC | PRN
  Administered 2018-07-05: 1 mg via INTRAVENOUS
  Filled 2018-07-05: qty 1

## 2018-07-05 NOTE — Plan of Care (Signed)
  Problem: Health Behavior/Discharge Planning: Goal: Ability to manage health-related needs will improve 07/05/2018 1729 by Audrea Muscat, RN Outcome: Progressing 07/05/2018 1623 by Audrea Muscat, RN Outcome: Progressing   Problem: Clinical Measurements: Goal: Ability to maintain clinical measurements within normal limits will improve 07/05/2018 1729 by Audrea Muscat, RN Outcome: Progressing 07/05/2018 1623 by Audrea Muscat, RN Outcome: Progressing Goal: Will remain free from infection 07/05/2018 1729 by Audrea Muscat, RN Outcome: Progressing 07/05/2018 1623 by Audrea Muscat, RN Outcome: Progressing Goal: Diagnostic test results will improve 07/05/2018 1729 by Audrea Muscat, RN Outcome: Progressing 07/05/2018 1623 by Audrea Muscat, RN Outcome: Progressing   Problem: Activity: Goal: Risk for activity intolerance will decrease 07/05/2018 1729 by Audrea Muscat, RN Outcome: Progressing 07/05/2018 1623 by Audrea Muscat, RN Outcome: Progressing   Problem: Elimination: Goal: Will not experience complications related to bowel motility 07/05/2018 1729 by Audrea Muscat, RN Outcome: Progressing 07/05/2018 1623 by Audrea Muscat, RN Outcome: Progressing Goal: Will not experience complications related to urinary retention 07/05/2018 1729 by Audrea Muscat, RN Outcome: Progressing 07/05/2018 1623 by Audrea Muscat, RN Outcome: Progressing   Problem: Pain Managment: Goal: General experience of comfort will improve 07/05/2018 1729 by Audrea Muscat, RN Outcome: Progressing 07/05/2018 1623 by Audrea Muscat, RN Outcome: Progressing   Problem: Safety: Goal: Ability to remain free from injury will improve 07/05/2018 1729 by Audrea Muscat, RN Outcome: Progressing 07/05/2018 1623 by Audrea Muscat, RN Outcome: Progressing   Problem: Activity: Goal: Ability to ambulate and perform ADLs will improve 07/05/2018 1729 by Audrea Muscat, RN Outcome: Progressing 07/05/2018  1623 by Audrea Muscat, RN Outcome: Progressing   Problem: Clinical Measurements: Goal: Postoperative complications will be avoided or minimized 07/05/2018 1729 by Audrea Muscat, RN Outcome: Progressing 07/05/2018 1623 by Audrea Muscat, RN Outcome: Progressing   Problem: Self-Concept: Goal: Ability to maintain and perform role responsibilities to the fullest extent possible will improve 07/05/2018 1729 by Audrea Muscat, RN Outcome: Progressing 07/05/2018 1623 by Audrea Muscat, RN Outcome: Progressing   Problem: Pain Management: Goal: Pain level will decrease 07/05/2018 1729 by Audrea Muscat, RN Outcome: Progressing 07/05/2018 1623 by Audrea Muscat, RN Outcome: Progressing

## 2018-07-05 NOTE — TOC Initial Note (Signed)
Transition of Care [facility]) - Initial/Assessment Note    Patient Details  Name: Janice Rogers MRN: [id number] Date of Birth: 07-25-1928  Transition of Care [facility]) CM/SW Contact:    Lia Hopping, [place] Phone Number: 07/05/2018, 10:51 AM  Clinical Narrative:                 CSW reached out to the patient spouse Juanda Crumble to discuss SNF placement. Patient spouse reports he does not want the patient to to SNF due to fear patient may contract COVID-19. Spouse reports the patient had surgery in April after she fell and suffered a fracture. At the time the patient discharge with home health services through [facility] and [facility] services.   Spouse request patient return home and resume those services. Patient has been receiving physical therapy once a week, and has a visit from the hospice nurse once a week. Patient spouse also has private aids that alternate 6 days a week from 9am-4pm to assist with the patient care. Spouse reports the patient has various DME- rolling walkers, cane, hoyer lift, bedside commode.   CSW reached out to nurse Amy with [facility]. She reports they can resume services at home. She also plans to talk with spouse about ordering the patient a Hospital bed.   CSW reached out to Santiago Glad with [facility], they will resume services with orders from the physician.   CSW notified nurse. Nurse will follow up with the physician.   Expected Discharge Plan: [place] Barriers to Discharge: No Barriers Identified   Patient Goals and CMS Choice   CMS Medicare.gov Compare Post Acute Care list provided to:: Patient Choice offered to / list presented to : Adult Children  Expected Discharge Plan and Services Expected Discharge Plan: [place] In-house Referral: Clinical Social Work Discharge Planning Services: CM Consult Post Acute Care Choice: [place] arrangements for the past 2 months: [place]: [place] ([place]) Date [place]: 07/05/18 Time [place]: 0086 Representative spoke with at [place]: Santiago Glad  Prior Living Arrangements/Services Living arrangements for the past 2 months: [place] with:: Spouse Patient language and need for interpreter reviewed:: No Do you feel safe going back to the place where you live?: Yes      Need for Family Participation in Patient Care: Yes (Comment) Care giver support system in place?: Yes (comment) Current home services: Home PT, Hospice Criminal Activity/Legal Involvement Pertinent to Current Situation/Hospitalization: No - Comment as needed  Activities of Daily [place] Devices/Equipment: Cane (specify quad or straight), Shower chair without back, Wheelchair, Environmental consultant (specify type) ADL Screening (condition at time of admission) Patient's cognitive ability adequate to safely complete daily activities?: No Is the patient deaf or have difficulty hearing?: Yes Does the patient have difficulty seeing, even when wearing glasses/contacts?: No Does the patient have difficulty concentrating, remembering, or making decisions?: Yes Patient able to express need for assistance with ADLs?: Yes Does the patient have difficulty dressing or bathing?: Yes Independently performs ADLs?: No Communication: Independent Dressing (OT): Needs assistance Is this a change from baseline?: Pre-admission baseline Grooming: Needs assistance Is this a change from baseline?: Pre-admission baseline Feeding: Needs assistance Is this a change from baseline?: Pre-admission baseline Bathing: Needs assistance  Is this a change from baseline?: Pre-admission baseline Toileting: Needs assistance Is this a change from baseline?: Pre-admission baseline In/Out Bed: Needs assistance Is this a change from baseline?: Pre-admission baseline Walks in Home: Needs  assistance Is this a change from baseline?: Pre-admission baseline Does the patient have difficulty walking or climbing stairs?: Yes Weakness of Legs: Both Weakness of Arms/Hands: None  Permission Sought/Granted Permission sought to share information with : Facility Art therapist granted to share information with : Yes, Verbal Permission Granted        Permission granted to share info w Relationship: Spouse Charles     Emotional Assessment Appearance:: Appears stated age Attitude/Demeanor/Rapport: Unable to Assess Affect (typically observed): Unable to Assess Orientation: : Oriented to Self Alcohol / Substance Use: Not Applicable Psych Involvement: No (comment)  Admission diagnosis:  fractured left hip Patient Active Problem List   Diagnosis Date Noted  . Fracture of femoral neck, left ([place]) 07/03/2018  . Closed left hip fracture ([place]) 07/03/2018  . Anemia of chronic disease 04/14/2018  . Acute blood loss anemia 04/14/2018  . Mental status alteration 04/14/2018  . Displaced supracondylar fracture without intracondylar extension of lower end of left femur, initial encounter for closed fracture ([place]) 04/12/2018  . Chemotherapy-induced neuropathy ([place]) 04/27/2016  . Ovarian cancer on left ([place]) 08/05/2015  . B12 deficiency 08/05/2015   PCP:  Neale Burly, MD Pharmacy:   [facility], [place] [address] [place] [address] Phone: [phone] Fax: [phone]     Social Determinants of Health (SDOH) Interventions    Readmission Risk Interventions No flowsheet data found.

## 2018-07-05 NOTE — Progress Notes (Signed)
Today at 1340 the patient became very agitated (while still in room 1341), unknown reason.  Attempting to get out of bed, angry at the staff.  Would not follow commands.  Haldol was given at 1349.  The patient became calm in about 30 minutes.  She was moved to a room close to the desk when one became available.  Her husband was notified of the room change and the reason.

## 2018-07-05 NOTE — Progress Notes (Signed)
     Subjective:  Patient reports pain as mild.  Patient had confusion overnight, above baseline.  Given a Foley catheter, significant urinary retention, as well as Haldol, and her cognitive function seemed to improve.  Objective:   VITALS:   Vitals:   07/04/18 2120 07/05/18 0133 07/05/18 0541 07/05/18 0938  BP: (!) 146/74 (!) 172/66 (!) 167/78 140/63  Pulse: 61 66 75 68  Resp: 18 18 16 16   Temp: 98.1 F (36.7 C) 97.7 F (36.5 C) 98.3 F (36.8 C) 98.4 F (36.9 C)  TempSrc: Oral Oral Oral Oral  SpO2: 96% 99% 100% 99%  Weight:      Height:        Neurologically intact Incision: scant drainage   Lab Results  Component Value Date   WBC 8.9 07/05/2018   HGB 11.2 (L) 07/05/2018   HCT 35.6 (L) 07/05/2018   MCV 93.2 07/05/2018   PLT 168 07/05/2018   BMET    Component Value Date/Time   NA 141 07/05/2018 0327   K 3.8 07/05/2018 0327   CL 109 07/05/2018 0327   CO2 21 (L) 07/05/2018 0327   GLUCOSE 102 (H) 07/05/2018 0327   BUN 22 07/05/2018 0327   CREATININE 0.94 07/05/2018 0327   CALCIUM 8.9 07/05/2018 0327   GFRNONAA 55 (L) 07/05/2018 0327   GFRAA >60 07/05/2018 0327     Assessment/Plan: 2 Days Post-Op   Principal Problem:   Fracture of femoral neck, left (HCC) Active Problems:   Closed left hip fracture (HCC)   Advance diet Up with therapy Continue foley due to acute urinary retention We will have to determine her appropriate disposition planning, she may require skilled nursing placement.  Anticipated LOS equal to or greater than 2 midnights due to - Age 83 and older with one or more of the following:  - Obesity  - Expected need for hospital services (PT, OT, Nursing) required for safe  discharge  - Anticipated need for postoperative skilled nursing care or inpatient rehab  - Active co-morbidities: delirium, due to urinary retention and dimentia underlying OR   - Unanticipated findings during/Post Surgery: Slow post-op progression: GI, pain control,  mobility  - Patient is a high risk of re-admission due to: Non-elective hospital admission within previous 6 months     Johnny Bridge 07/05/2018, 10:19 AM   Marchia Bond, MD Cell [phone]

## 2018-07-05 NOTE — Care Management Important Message (Signed)
Important Message  Patient Details IM Letter given to Kathrin Greathouse SW to present to the Patient Name: Janice Rogers MRN: [id number] Date of Birth: 1928-01-17   Medicare Important Message Given:  Yes     Kerin Salen 07/05/2018, 11:43 AM

## 2018-07-06 LAB — CBC
HCT: 38.2 % (ref 36.0–46.0)
Hemoglobin: 11.5 g/dL — ABNORMAL LOW (ref 12.0–15.0)
MCH: 27.8 pg (ref 26.0–34.0)
MCHC: 30.1 g/dL (ref 30.0–36.0)
MCV: 92.5 fL (ref 80.0–100.0)
Platelets: 212 10*3/uL (ref 150–400)
RBC: 4.13 MIL/uL (ref 3.87–5.11)
RDW: 13.7 % (ref 11.5–15.5)
WBC: 8 10*3/uL (ref 4.0–10.5)
nRBC: 0 % (ref 0.0–0.2)

## 2018-07-06 MED ORDER — ENSURE SURGERY PO LIQD
237.0000 mL | Freq: Two times a day (BID) | ORAL | Status: DC
  Administered 2018-07-06 – 2018-07-08 (×3): 237 mL via ORAL
  Filled 2018-07-06 (×5): qty 237

## 2018-07-06 NOTE — Progress Notes (Signed)
Physical Therapy Treatment Patient Details Name: Janice Rogers MRN: [id number] DOB: 10-15-1928 Today's Date: 07/06/2018    History of Present Illness Janice Rogers is a 85 y.o. female who presents for preoperative history and physical with a diagnosis of left femoral neck fracture. S/p L Hip pinning    PT Comments    Pt progressing. Pt is able to perform scooting transfer with min assist  And maintain TDWB (she is unable to stand and maintain TDWB). If family and caregivers able to manage her at w/c level she could likely d/c home with HHPT.  Per RN and PA the family prefers pt to return home vs going SNF.  Pt has all DME other than hospital bed. Will  Continue to follow in acute setting.   Follow Up Recommendations  Follow surgeon's recommendation for DC plan and follow-up therapies     Equipment Recommendations  None recommended by PT    Recommendations for Other Services       Precautions / Restrictions Precautions Precautions: Fall Restrictions LLE Weight Bearing: Touchdown weight bearing    Mobility  Bed Mobility Overal bed mobility: Needs Assistance Bed Mobility: Supine to Sit     Supine to sit: Min assist     General bed mobility comments: multi-modal cues for technique  Transfers Overall transfer level: Needs assistance Equipment used: Rolling walker (2 wheeled) Transfers: Sit to/from Stand;Lateral/Scoot Transfers Sit to Stand: Mod assist        Lateral/Scoot Transfers: Min assist General transfer comment: pt able to scoot laterally with min assist and maintain TDWB/NWB. pt is mod/max assist to stand and unable to maintain TDWB in standing  Ambulation/Gait             General Gait Details: unable    Stairs             Wheelchair Mobility    Modified Rankin (Stroke Patients Only)       Balance   Sitting-balance support: No upper extremity supported Sitting balance-Leahy Scale: Fair     Standing balance support:  Bilateral upper extremity supported Standing balance-Leahy Scale: Zero                              Cognition Arousal/Alertness: Awake/alert Behavior During Therapy: WFL for tasks assessed/performed Overall Cognitive Status: No family/caregiver present to determine baseline cognitive functioning                                 General Comments: slow to process, difficulty with sequencing and following multi step commands,      Exercises Total Joint Exercises Heel Slides: AAROM;AROM;Left;5 reps    General Comments        Pertinent Vitals/Pain Pain Assessment: Faces Faces Pain Scale: Hurts a little bit Pain Location: L hip Pain Descriptors / Indicators: Discomfort Pain Intervention(s): Monitored during session    Home Living                      Prior Function            PT Goals (current goals can now be found in the care plan section) Acute Rehab PT Goals Patient Stated Goal: To walk again PT Goal Formulation: With patient Time For Goal Achievement: 07/11/18 Potential to Achieve Goals: Good Progress towards PT goals: Progressing toward goals    Frequency    Min  3X/week      PT Plan Current plan remains appropriate    Co-evaluation              AM-PAC PT "6 Clicks" Mobility   Outcome Measure  Help needed turning from your back to your side while in a flat bed without using bedrails?: A Little Help needed moving from lying on your back to sitting on the side of a flat bed without using bedrails?: A Little Help needed moving to and from a bed to a chair (including a wheelchair)?: A Little Help needed standing up from a chair using your arms (e.g., wheelchair or bedside chair)?: Total Help needed to walk in hospital room?: Total Help needed climbing 3-5 steps with a railing? : Total 6 Click Score: 12    End of Session Equipment Utilized During Treatment: Gait belt Activity Tolerance: Patient tolerated treatment  well Patient left: in chair;with call bell/phone within reach;with chair alarm set   PT Visit Diagnosis: Other abnormalities of gait and mobility (R26.89);History of falling (Z91.81)     Time: 0102-7253 PT Time Calculation (min) (ACUTE ONLY): 26 min  Charges:  $Therapeutic Activity: 23-37 mins                     Kenyon Ana, PT  Pager: [phone] Acute Rehab Dept [facility]): [phone]   07/06/2018    [facility] 07/06/2018, 4:14 PM

## 2018-07-06 NOTE — Progress Notes (Signed)
Initial Nutrition Assessment  INTERVENTION:   Provide Ensure Surgery BID, each provides 330 kcal and 18g protein.  NUTRITION DIAGNOSIS:   Increased nutrient needs related to post-op healing as evidenced by estimated needs.  GOAL:   Patient will meet greater than or equal to 90% of their needs  MONITOR:   PO intake, Supplement acceptance, Labs, Weight trends, I & O's  REASON FOR ASSESSMENT:   Consult Hip fracture protocol  ASSESSMENT:   85 y.o. female who presents with a diagnosis of left femoral neck fracture. History of ovarian cancer. 6/30: s/p CANNULATED HIP PINNING (Left)  **RD working remotely**  Patient currently consuming 50% of meals today (each providing ~300-400 kcal and 16g protein). Pt consumed 25-75% of meals yesterday 7/2. Given recent surgery, will order Ensure Surgery supplements to aid in post-op healing.   Per weight records, pt with no recent weight loss. Last recorded weight was 134 lbs on 04/13/18.   Labs reviewed. Medications: Ferrous sulfate tablet TID   NUTRITION - FOCUSED PHYSICAL EXAM:  Unable to perform -working remotely.  Diet Order:   Diet Order            Diet regular Room service appropriate? Yes; Fluid consistency: Thin  Diet effective now              EDUCATION NEEDS:   Not appropriate for education at this time  Skin:  Skin Assessment: Reviewed RN Assessment  Last BM:  7/3  Height:   Ht Readings from Last 1 Encounters:  07/04/18 5\' 2"  (1.575 m)    Weight:   Wt Readings from Last 1 Encounters:  07/04/18 62.9 kg    Ideal Body Weight:  50 kg  BMI:  Body mass index is 25.36 kg/m.  Estimated Nutritional Needs:   Kcal:  1600-1800  Protein:  75-85g  Fluid:  1.8L/day  Clayton Bibles, MS, RD, LDN [place] Dietitian Pager: [phone] After Hours Pager: [phone]

## 2018-07-06 NOTE — Progress Notes (Signed)
Subjective: 3 Days Post-Op Procedure(s) (LRB): CANNULATED HIP PINNING (Left) Patient reports pain as mild.    Objective: Vital signs in last 24 hours: Temp:  [98.6 F (37 C)-99 F (37.2 C)] 99 F (37.2 C) (07/03 1152) Pulse Rate:  [51-67] 67 (07/03 1152) Resp:  [14-18] 18 (07/03 1152) BP: (147-186)/(69-85) 147/72 (07/03 1152) SpO2:  [96 %-98 %] 97 % (07/03 1152)  Intake/Output from previous day: 07/02 0701 - 07/03 0700 In: 1431.5 [P.O.:540; I.V.:891.5] Out: 3600 [Urine:3600] Intake/Output this shift: Total I/O In: 240 [P.O.:240] Out: 400 [Urine:400]  Recent Labs    07/03/18 1722 07/04/18 0329 07/05/18 0327 07/06/18 0254  HGB 12.2 12.4 11.2* 11.5*   Recent Labs    07/05/18 0327 07/06/18 0254  WBC 8.9 8.0  RBC 3.82* 4.13  HCT 35.6* 38.2  PLT 168 212   Recent Labs    07/04/18 0329 07/05/18 0327  NA 139 141  K 4.2 3.8  CL 106 109  CO2 22 21*  BUN 17 22  CREATININE 1.00 0.94  GLUCOSE 182* 102*  CALCIUM 9.1 8.9   No results for input(s): LABPT, INR in the last 72 hours.  Neurovascular intact Sensation intact distally Intact pulses distally Dorsiflexion/Plantar flexion intact Incision: dressing C/D/I   Assessment/Plan: 3 Days Post-Op Procedure(s) (LRB): CANNULATED HIP PINNING (Left) Up with therapy TDWB LLE D/c foley voiding trial if continues to have retention consult medicine team Minimal appetite, nutrition consult per hip fracture protocol Per SW husband would like pt home with [facility] services which were already in place would continue to aim for this goal Pending above could d/c as early as tomorrow       Chriss Czar 07/06/2018, 3:12 PM

## 2018-07-07 MED ORDER — ENSURE SURGERY PO LIQD: 237.0000 mL | Freq: Two times a day (BID) | ORAL | 0 refills | Status: AC

## 2018-07-07 NOTE — Progress Notes (Signed)
Physical Therapy Treatment Patient Details Name: Janice Rogers MRN: [id number] DOB: 09/16/1928 Today's Date: 07/07/2018    History of Present Illness Janice Rogers is a 85 y.o. female who presents for preoperative history and physical with a diagnosis of left femoral neck fracture. S/p L Hip pinning    PT Comments    POD # 4 Pt AxOx2.  Pleasant and following repeat instructions.  HOH is also a barrier.  Assisted OOB.  General bed mobility comments: multi-modal cues for technique, increased time, used bed pad to complete scooting.  Decreased weight shift L due to pain and swelling.General transfer comment: attempted to teach pt how to 1/4 pivot towrads her R without using walker.  75% VC's on proper hand placement, hand transfer and turn completion from elevated bed to [facility] then back to bed. Positioned to comfort.   Spoke with spouse on phone during session.  He is able to care for pt at present level.  Plans to have pt transported via PTAR to home up to second level bedroom where she has a complete "set up".  Spouse aware she is TTWB.  Spouse stated he has extra help at home when needed.    Follow Up Recommendations  Home health PT  24/7 family assist    Equipment Recommendations  (has all equipment from prior)    Recommendations for Other Services       Precautions / Restrictions Precautions Precautions: Fall Restrictions Weight Bearing Restrictions: Yes LLE Weight Bearing: Touchdown weight bearing    Mobility  Bed Mobility Overal bed mobility: Needs Assistance Bed Mobility: Supine to Sit;Sit to Supine     Supine to sit: Mod assist Sit to supine: Mod assist;Max assist   General bed mobility comments: multi-modal cues for technique, increased time, used bed pad to complete scooting.  Decreased weight shift L due to pain and swelling.  Transfers Overall transfer level: Needs assistance Equipment used: None   Sit to Stand: Mod assist   Squat pivot  transfers: Mod assist;Max assist     General transfer comment: attempted to teach pt how to 1/4 pivot towrads her R without using walker.  75% VC's on proper hand placement, hand transfer and turn completion from elevated bed to [facility] then back to bed.  Ambulation/Gait             General Gait Details: unable to perform at [employer]   Stairs             Wheelchair Mobility    Modified Rankin (Stroke Patients Only)       Balance                                            Cognition Arousal/Alertness: Awake/alert Behavior During Therapy: WFL for tasks assessed/performed                                   General Comments: slow to process, difficulty with sequencing and following multi step commands,   HOH      Exercises      General Comments        Pertinent Vitals/Pain Pain Assessment: Faces Faces Pain Scale: Hurts little more Pain Location: L hip Pain Descriptors / Indicators: Discomfort;Grimacing Pain Intervention(s): Monitored during session;Repositioned    Home Living  Prior Function            PT Goals (current goals can now be found in the care plan section) Progress towards PT goals: Progressing toward goals    Frequency    Min 3X/week      PT Plan Current plan remains appropriate    Co-evaluation              AM-PAC PT "6 Clicks" Mobility   Outcome Measure  Help needed turning from your back to your side while in a flat bed without using bedrails?: A Lot Help needed moving from lying on your back to sitting on the side of a flat bed without using bedrails?: A Lot Help needed moving to and from a bed to a chair (including a wheelchair)?: A Lot Help needed standing up from a chair using your arms (e.g., wheelchair or bedside chair)?: A Lot Help needed to walk in hospital room?: A Lot Help needed climbing 3-5 steps with a railing? : Total 6 Click Score: 11    End of  Session Equipment Utilized During Treatment: Gait belt Activity Tolerance: Patient tolerated treatment well Patient left: in bed Nurse Communication: Mobility status PT Visit Diagnosis: Other abnormalities of gait and mobility (R26.89);History of falling (Z91.81)     Time: 1002-1030 PT Time Calculation (min) (ACUTE ONLY): 28 min  Charges:  $Therapeutic Exercise: 23-37 mins                     Rica Koyanagi  PTA Acute  Rehabilitation Services Pager      [phone] Office      [phone]

## 2018-07-07 NOTE — Plan of Care (Signed)
  Problem: Health Behavior/Discharge Planning: Goal: Ability to manage health-related needs will improve Outcome: Progressing   Problem: Clinical Measurements: Goal: Ability to maintain clinical measurements within normal limits will improve Outcome: Progressing Goal: Will remain free from infection Outcome: Progressing Goal: Diagnostic test results will improve Outcome: Progressing   Problem: Activity: Goal: Risk for activity intolerance will decrease Outcome: Progressing   Problem: Elimination: Goal: Will not experience complications related to bowel motility Outcome: Progressing Goal: Will not experience complications related to urinary retention Outcome: Progressing   Problem: Pain Managment: Goal: General experience of comfort will improve Outcome: Progressing

## 2018-07-07 NOTE — Progress Notes (Signed)
Subjective: 4 Days Post-Op Procedure(s) (LRB): CANNULATED HIP PINNING (Left) Patient reports pain as mild.  Voiding after foley d/c does have some incontinence overnight.  Nutrition consulted recommending ensure to supplement.   Objective: Vital signs in last 24 hours: Temp:  [98.8 F (37.1 C)-99 F (37.2 C)] 98.8 F (37.1 C) (07/04 0610) Pulse Rate:  [62-69] 64 (07/04 0903) Resp:  [16-18] 16 (07/04 0610) BP: (147-174)/(64-76) 149/64 (07/04 0903) SpO2:  [96 %-100 %] 100 % (07/04 0903)  Intake/Output from previous day: 07/03 0701 - 07/04 0700 In: 1320 [P.O.:1320] Out: 751 [Urine:750; Stool:1] Intake/Output this shift: Total I/O In: 240 [P.O.:240] Out: -   Recent Labs    07/05/18 0327 07/06/18 0254  HGB 11.2* 11.5*   Recent Labs    07/05/18 0327 07/06/18 0254  WBC 8.9 8.0  RBC 3.82* 4.13  HCT 35.6* 38.2  PLT 168 212   Recent Labs    07/05/18 0327  NA 141  K 3.8  CL 109  CO2 21*  BUN 22  CREATININE 0.94  GLUCOSE 102*  CALCIUM 8.9   No results for input(s): LABPT, INR in the last 72 hours.  Neurovascular intact Sensation intact distally Intact pulses distally Dorsiflexion/Plantar flexion intact Incision: dressing C/D/I   Assessment/Plan: 4 Days Post-Op Procedure(s) (LRB): CANNULATED HIP PINNING (Left) Up with therapy TDWB LLE Per SW husband would like pt home with [facility] services which were already in place would continue to aim for this goal Pending above could d/c as early as today if everything is arranged by case management (unclear of staffing on holiday) vs tomorrow. Would still like PT input to determine when safe for d/c home with help but again goal would be for tomorrow.    Vonna Kotyk [place] 07/07/2018, 10:51 AM

## 2018-07-08 NOTE — TOC Progression Note (Signed)
Transition of Care [facility] - Progression Note    Patient Details  Name: Janice Rogers MRN: [id number] Date of Birth: 1928/08/14  [facility]) CM/SW Contact  Joaquin Courts, RN Phone Number: 07/08/2018, 9:38 AM  Clinical Narrative:    CM spoke with spouse over the telephone who reports that he has been offered a hospital bed in the past but has declined it. States that patient is active with [facility] and they can get a hospital bed anytime if they feel they need it. At this time, spouse would like to hold off on ordering a hospital bed, he is aware that should he decide he needs one after d/c, [facility] will be his contact to make arrangements.     Expected Discharge Plan: [place] Barriers to Discharge: No Barriers Identified  Expected Discharge Plan and Services Expected Discharge Plan: [place] In-house Referral: Clinical Social Work Discharge Planning Services: CM Consult Post Acute Care Choice: [place] arrangements for the past 2 months: [place]: [place] ([place]) Date [place]: 07/05/18 Time [place]: 1155 Representative spoke with at [place]: [place] ([place]) Interventions    Readmission Risk Interventions No flowsheet data found.

## 2018-07-08 NOTE — Discharge Summary (Signed)
PATIENT ID: Janice Rogers        MRN:  [id number]          DOB/AGE: Jan 30, 1928 / 85 y.o.    DISCHARGE SUMMARY  ADMISSION DATE:    07/03/2018 DISCHARGE DATE:   07/08/2018   ADMISSION DIAGNOSIS: fractured left hip    DISCHARGE DIAGNOSIS:  fractured left hip    ADDITIONAL DIAGNOSIS: Principal Problem:   Fracture of femoral neck, left (HCC) Active Problems:   Closed left hip fracture [facility])  Past Medical History:  Diagnosis Date  . Acute blood loss anemia 04/14/2018  . Anemia of chronic disease 04/14/2018  . B12 deficiency 08/05/2015  . Cancer ([place])   . Chemotherapy-induced neuropathy ([place]) 04/27/2016  . Fracture of femoral neck, left ([place]) 07/03/2018  . Mental status alteration 04/14/2018  . Ovarian cancer on left ([place]) 08/05/2015    PROCEDURE: Procedure(s): CANNULATED HIP PINNING Left on 07/03/2018  CONSULTS: PT/SW /Nutrition  HISTORY:  See H&P in chart  HOSPITAL COURSE:  Janice Rogers is a 85 y.o. admitted on 07/03/2018 and found to have a diagnosis of fractured left hip.  After appropriate laboratory studies were obtained  they were taken to the operating room on 07/03/2018 and underwent  Procedure(s): CANNULATED HIP PINNING  Left.   They were given perioperative antibiotics:  Anti-infectives (From admission, onward)   Start     Dose/Rate Route Frequency Ordered Stop   07/04/18 0600  ceFAZolin (ANCEF) IVPB 2g/100 mL premix     2 g 200 mL/hr over 30 Minutes Intravenous On call to O.R. 07/03/18 1210 07/03/18 1719   07/03/18 2300  ceFAZolin (ANCEF) IVPB 2g/100 mL premix     2 g 200 mL/hr over 30 Minutes Intravenous Every 6 hours 07/03/18 2007 07/04/18 0451    .  Tolerated the procedure well.  Placed with a foley intraoperatively.    Patient with what sounded to be her baseline confusion worse at night   POD #1, allowed out of bed to a chair.  PT for exercise program.   POD #2, continued PT.    The remainder of the hospital course was dedicated to ambulation  and strengthening.   The patient was discharged on 5 Days Post-Op in  Stable condition.  Blood products given:none  DIAGNOSTIC STUDIES: Recent vital signs:  Patient Vitals for the past 24 hrs:  BP Temp Temp src Pulse Resp SpO2  07/08/18 0951 135/63 - - (!) 57 - -  07/08/18 0618 (!) 166/68 98.4 F (36.9 C) Oral (!) 55 16 98 %  07/07/18 2056 (!) 160/65 98.9 F (37.2 C) Oral 65 16 98 %  07/07/18 1401 (!) 146/69 98.3 F (36.8 C) Oral 61 15 96 %       Recent laboratory studies: Recent Labs    07/03/18 1722 07/04/18 0329 07/05/18 0327 07/06/18 0254  WBC  --  8.2 8.9 8.0  HGB 12.2 12.4 11.2* 11.5*  HCT 36.0 41.3 35.6* 38.2  PLT  --  207 168 212   Recent Labs    07/03/18 1722 07/04/18 0329 07/05/18 0327  NA 142 139 141  K 3.5 4.2 3.8  CL  --  106 109  CO2  --  22 21*  BUN  --  17 22  CREATININE  --  1.00 0.94  GLUCOSE 111* 182* 102*  CALCIUM  --  9.1 8.9   Lab Results  Component Value Date   INR 1.0 03/27/2008     Recent Radiographic Studies :  Pelvis  Portable  Result Date: 07/03/2018 CLINICAL DATA:  Postop EXAM: PORTABLE PELVIS 1-2 VIEWS COMPARISON:  CT 06/30/2018, radiograph 04/12/2018 FINDINGS: Pubic symphysis and rami are intact. The right hip is normally aligned. Interval screw fixation of the proximal left femur for femoral neck fracture. Hardware appears intact. Prior intramedullary rod within the proximal left femur. IMPRESSION: Interval screw fixation of the proximal left femur for subcapital fracture. Electronically Signed   By: Donavan Foil M.D.   On: 07/03/2018 20:01   Dg C-arm 1-60 Min-no Report  Result Date: 07/03/2018 Fluoroscopy was utilized by the requesting physician.  No radiographic interpretation.   Dg Hip Operative Unilat W Or W/o Pelvis Left  Result Date: 07/03/2018 CLINICAL DATA:  Left hip pinning EXAM: OPERATIVE left HIP (WITH PELVIS IF PERFORMED) 2 VIEWS TECHNIQUE: Fluoroscopic spot image(s) were submitted for interpretation  post-operatively. COMPARISON:  04/12/2018, 06/30/2018 FINDINGS: Two low resolution intraoperative spot views of the left hip. Total fluoroscopy time was 1 minutes 26 seconds. The images demonstrate prior intramedullary rod within the proximal left femur. Placement of 3 threaded screws within the proximal left femur for femoral neck fracture. IMPRESSION: Internal fixation of left femoral neck fracture utilizing intraoperative fluoroscopic assistance. Electronically Signed   By: Donavan Foil M.D.   On: 07/03/2018 20:00    DISCHARGE INSTRUCTIONS: Discharge Instructions    For home use only DME Hospital bed   Complete by: As directed    Length of Need: 12 Months   The above medical condition requires: Patient requires the ability to reposition frequently   Bed type: Semi-electric      DISCHARGE MEDICATIONS:   Allergies as of 07/08/2018      Reactions   Doxycycline Rash, Hives, Itching   Gemcitabine Shortness Of Breath   Penicillins Hives, Rash, Itching   No reaction to ancef noted 4/10 during surgical IM nail (per Mardelle Matte Md)    Tetracyclines & Related Other (See Comments)   unknown      Medication List    STOP taking these medications   aspirin 81 MG chewable tablet   clopidogrel 75 MG tablet Commonly known as: PLAVIX   cyanocobalamin 1000 MCG/ML injection Commonly known as: (VITAMIN B-12)     TAKE these medications   acetaminophen 325 MG tablet Commonly known as: Tylenol Take 2 tablets (650 mg total) by mouth every 6 (six) hours as needed.   amLODipine 5 MG tablet Commonly known as: NORVASC Take 5 mg by mouth daily.   atenolol 25 MG tablet Commonly known as: TENORMIN Take 12.5 mg by mouth daily.   atorvastatin 20 MG tablet Commonly known as: LIPITOR Take 20 mg by mouth daily.   cetirizine 10 MG tablet Commonly known as: ZYRTEC Take 10 mg by mouth daily.   cholecalciferol 25 MCG (1000 UT) tablet Commonly known as: VITAMIN D3 Take 1,000 Units by mouth daily.    DULoxetine 30 MG capsule Commonly known as: CYMBALTA Take 30 mg by mouth 2 (two) times daily.   enoxaparin 40 MG/0.4ML injection Commonly known as: Lovenox Inject 0.4 mLs (40 mg total) into the skin daily for 28 days.   feeding supplement Liqd Take 237 mLs by mouth 2 (two) times daily between meals for 14 days.   fenofibrate 145 MG tablet Commonly known as: TRICOR Take 145 mg by mouth daily.   gabapentin 300 MG capsule Commonly known as: NEURONTIN Take 300 mg by mouth at bedtime.   gemfibrozil 600 MG tablet Commonly known as: LOPID Take 600 mg by mouth 2 (  two) times daily.   letrozole 2.5 MG tablet Commonly known as: FEMARA Take 2.5 mg by mouth daily.   levothyroxine 100 MCG tablet Commonly known as: SYNTHROID Take 100 mcg by mouth daily.   losartan 25 MG tablet Commonly known as: COZAAR Take 25 mg by mouth daily.   multivitamin with minerals tablet Take 1 tablet by mouth daily.   omeprazole 20 MG capsule Commonly known as: PRILOSEC Take 20 mg by mouth daily.   sennosides-docusate sodium 8.6-50 MG tablet Commonly known as: SENOKOT-S Take 2 tablets by mouth daily.   sertraline 100 MG tablet Commonly known as: ZOLOFT Take 100 mg by mouth daily.   traMADol 50 MG tablet Commonly known as: ULTRAM Take 50 mg by mouth daily.   triamterene-hydrochlorothiazide 37.5-25 MG tablet Commonly known as: MAXZIDE-25 Take 1 tablet by mouth daily.            Durable Medical Equipment  (From admission, onward)         Start     Ordered   07/07/18 0000  For home use only DME Hospital bed    Question Answer Comment  Length of Need 12 Months   The above medical condition requires: Patient requires the ability to reposition frequently   Bed type Semi-electric      07/07/18 1106          FOLLOW UP VISIT:   Follow-up Information    Marchia Bond, MD. Schedule an appointment as soon as possible for a visit in 2 weeks.   Specialty: Orthopedic Surgery Contact  information: [address] [place] [address] [phone]           DISPOSITION:   Home  Patient had previous [facility] assistance with PT/RN/aide as well as DME excluding hospital bed  CONDITION:  Stable   Chriss Czar, PA-C  07/08/2018 10:30 AM

## 2018-07-08 NOTE — TOC Transition Note (Signed)
Transition of Care [facility]) - CM/SW Discharge Note   Patient Details  Name: Janice Rogers MRN: [id number] Date of Birth: Dec 31, 1928  Transition of Care (TOC) CM/SW Contact:  Joaquin Courts, RN Phone Number: 07/08/2018, 11:14 AM   Clinical Narrative:    [facility] arranged to transport home for d/c   Final next level of care: Home w Home Health Services Barriers to Discharge: No Barriers Identified   Patient Goals and CMS Choice   CMS Medicare.gov Compare Post Acute Care list provided to:: Patient Choice offered to / list presented to : Adult Children  Discharge Placement                       Discharge Plan and Services In-house Referral: Clinical Social Work Discharge Planning Services: CM Consult Post Acute Care Choice: [place]: [place] ([place]) Date [place]: 07/05/18 Time [place]: 1410 Representative spoke with at [place]: [place] ([place]) Interventions     Readmission Risk Interventions No flowsheet data found.

## 2018-10-04 DEATH — deceased

## 2020-07-07 IMAGING — CR LEFT KNEE - COMPLETE 4+ VIEW
4 series · 4 of 4 positions shown · non-contrast
Comparison: 04/12/2018 left knee radiographs from [HOSPITAL] [place]

CLINICAL DATA: Stepped in a hole and fell last night. Left knee
pain and swelling.

EXAM:
LEFT KNEE - COMPLETE 4+ VIEW

[knee ap]
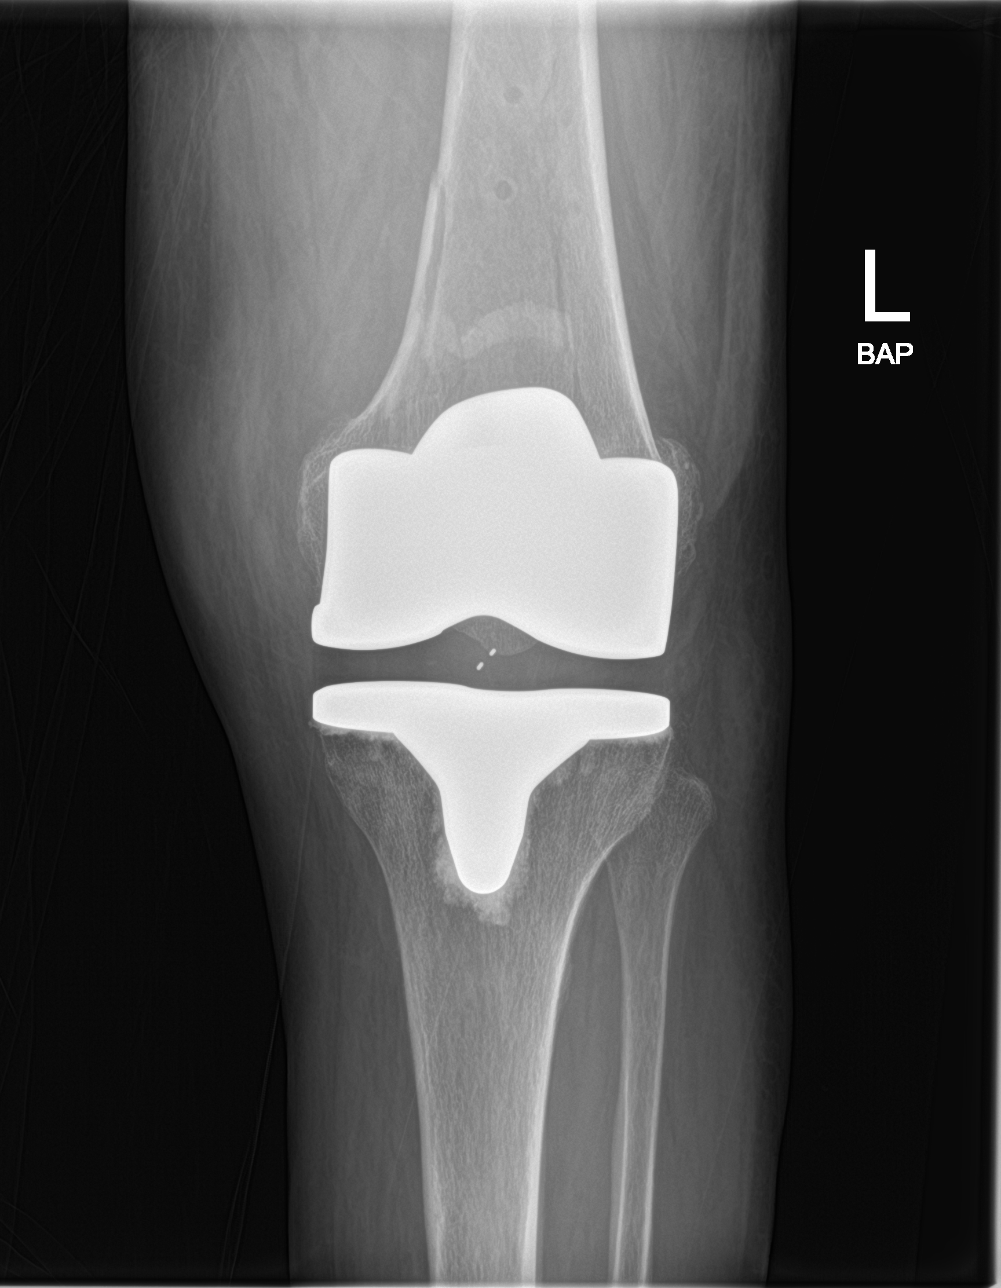

[knee lat]
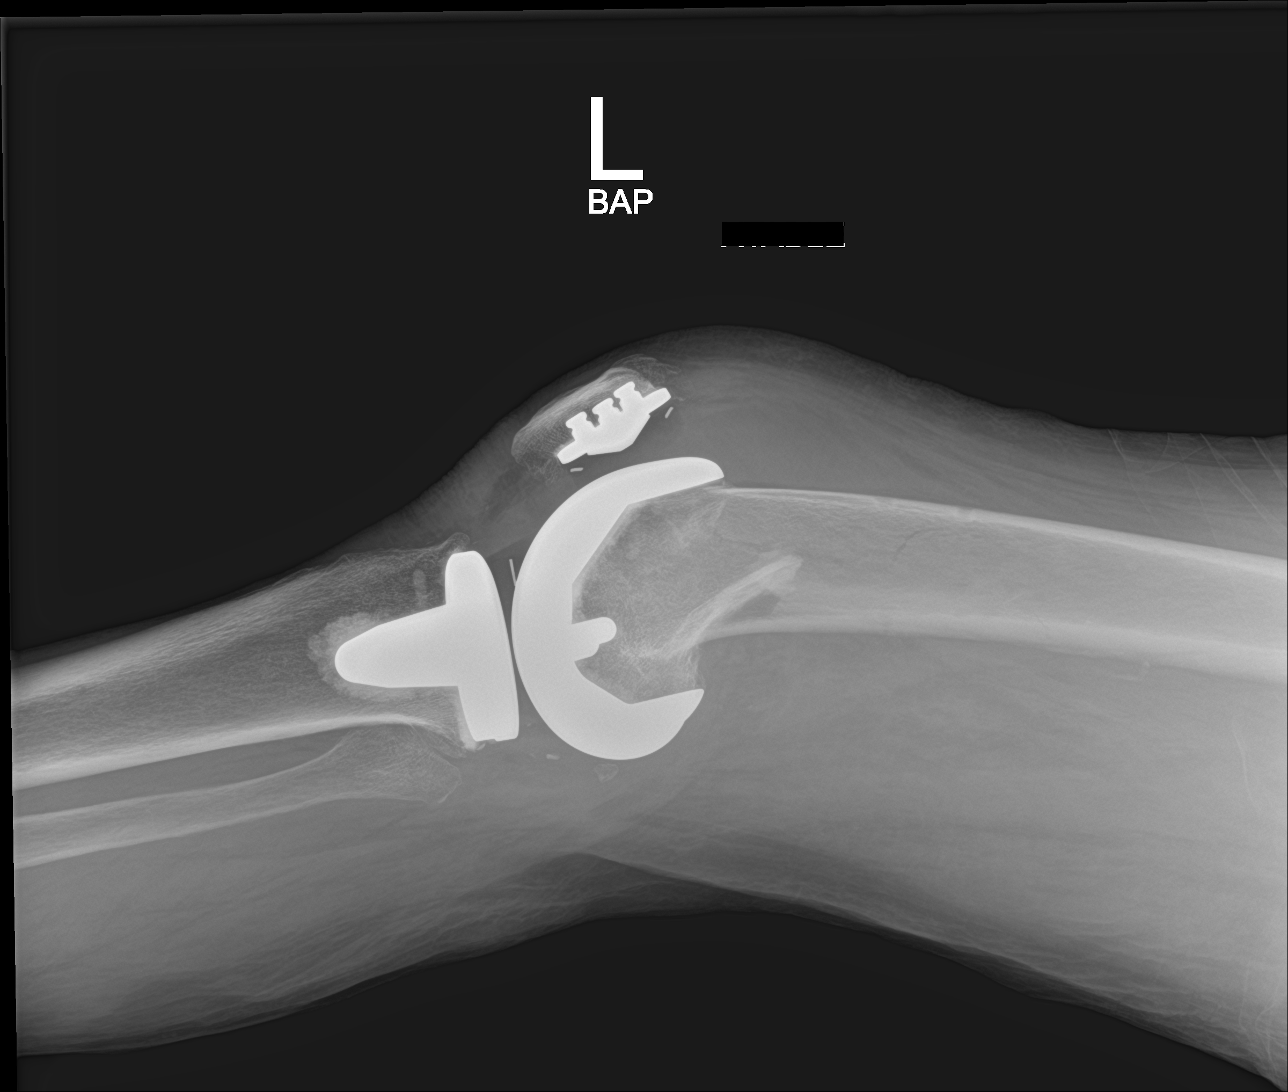

[knee obl (1 of 2)]
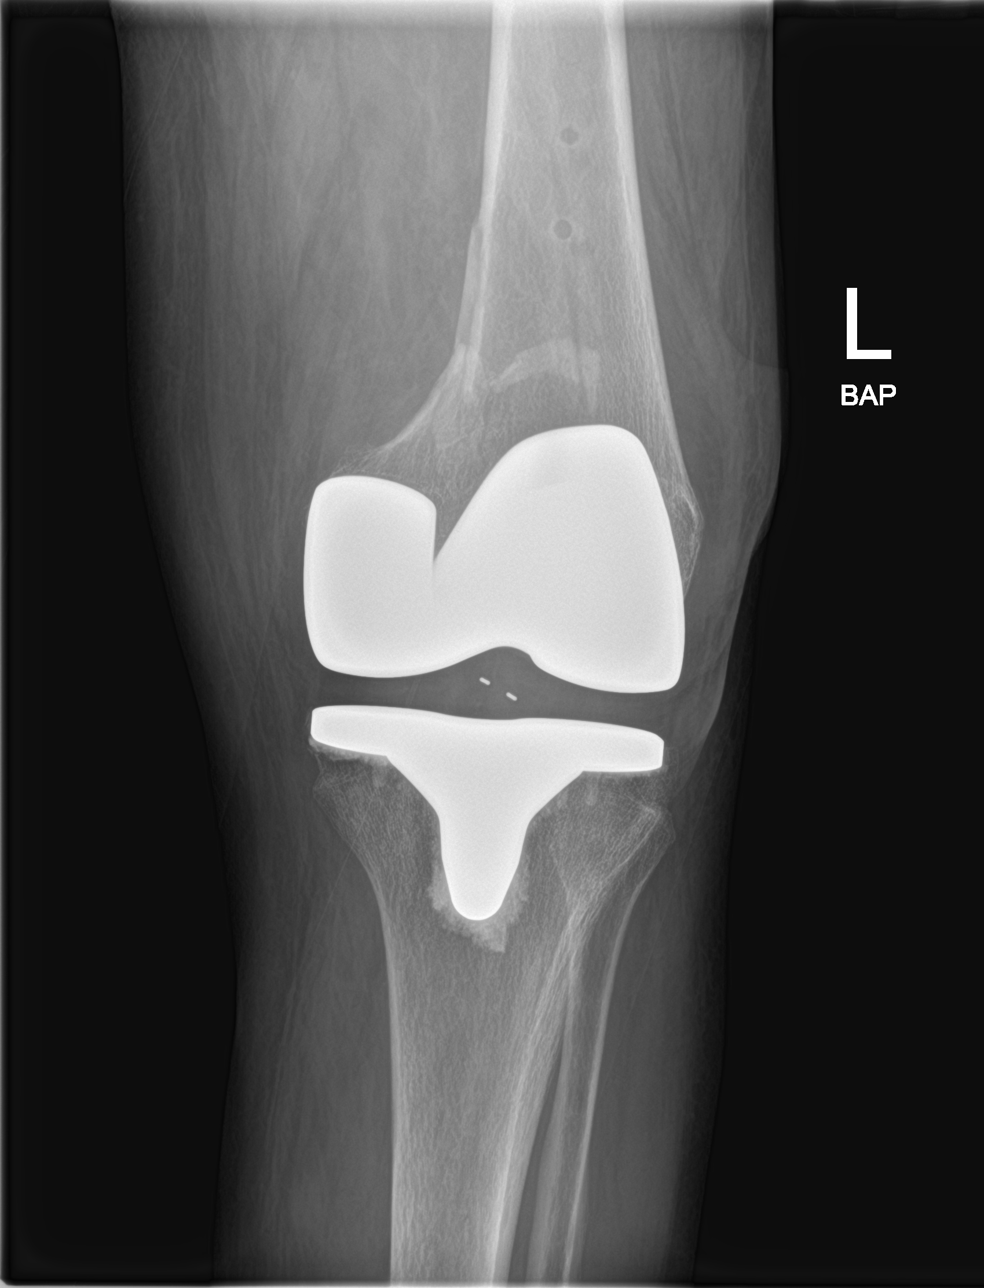

[knee obl (2 of 2)]
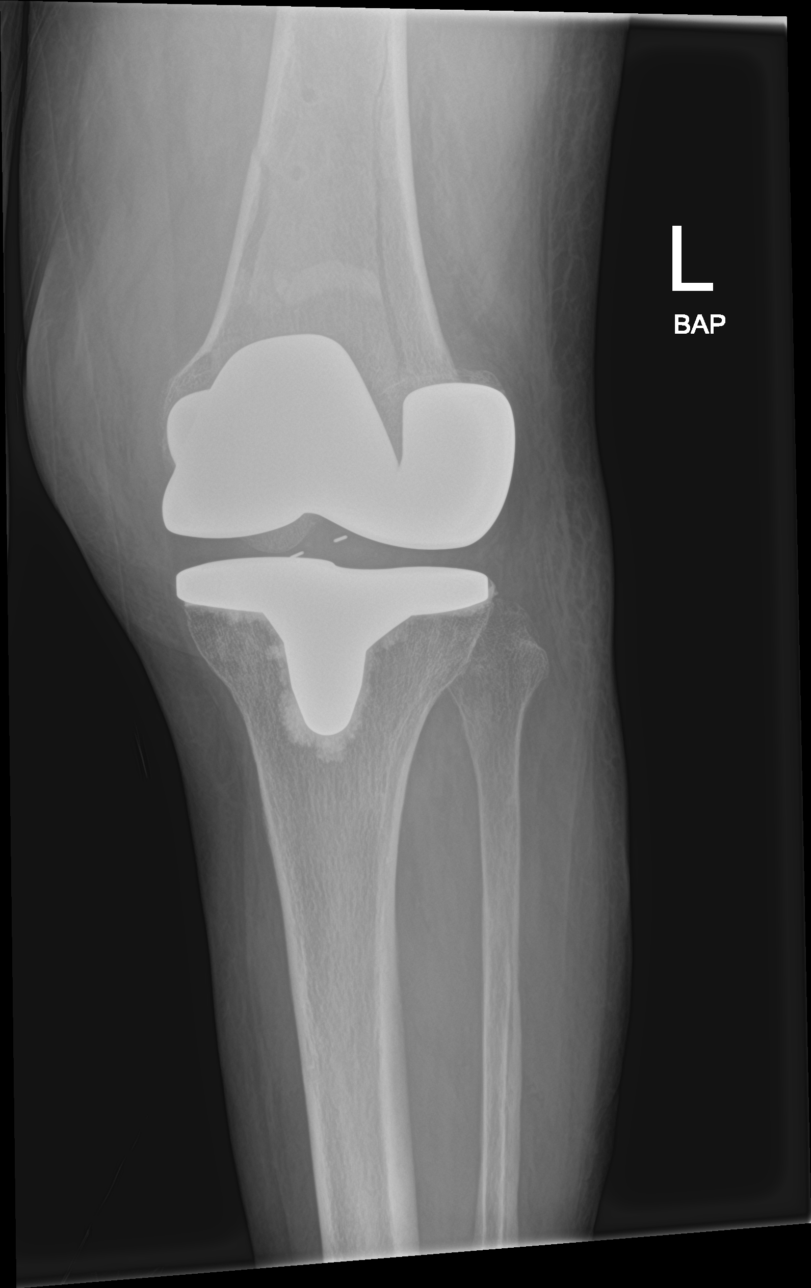

[4 of 4 positions shown; findings below may reference images not displayed]

FINDINGS: Sequelae of left total knee arthroplasty are again identified with
normal alignment of the prosthetic components. An oblique fracture
of the distal femur is unchanged and is again seen to extend from
the distal femoral shaft distally to the base of the femoral
component of the arthroplasty with a mildly impacted appearance
posteriorly. A moderately large knee joint effusion and surrounding
soft tissue swelling are again noted. There is no dislocation.
IMPRESSION: Unchanged distal femur fracture.

## 2020-07-07 IMAGING — CR PELVIS - 1-2 VIEW
1 series · 1 of 1 positions shown · non-contrast
Comparison: Radiograph dated 06/01/2016

CLINICAL DATA: Left lower extremity pain after a fall in her yard
last night. Swelling.

EXAM:
PELVIS - 1-2 VIEW

[pelvis ap]
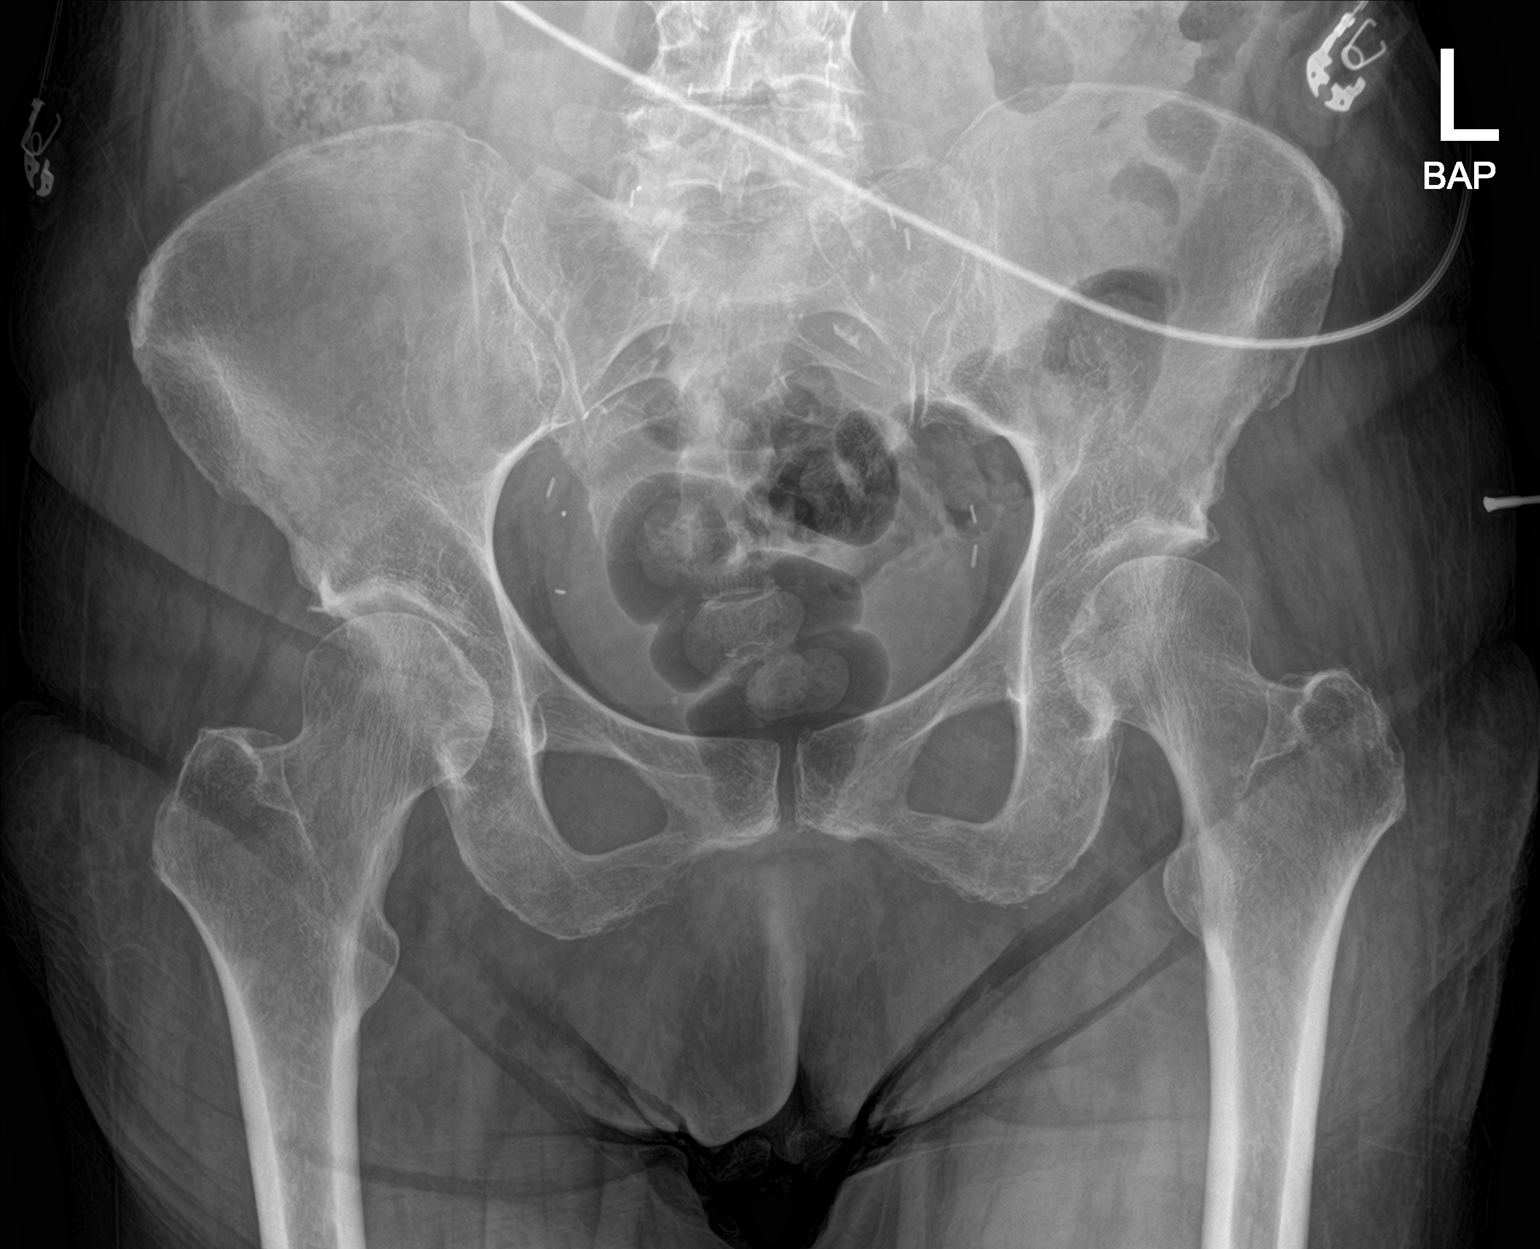

[1 of 1 positions shown; findings below may reference images not displayed]

FINDINGS: There is no evidence of pelvic fracture or diastasis. No pelvic bone
lesions are seen.
IMPRESSION: Negative.
